# Patient Record
Sex: Female | Born: 1990 | Marital: Married | Smoking: Never smoker
Health system: Southern US, Community
[De-identification: ages and names within clinical notes are randomized; demographics above are authoritative.]

## PROBLEM LIST (undated history)

## (undated) DIAGNOSIS — K859 Acute pancreatitis without necrosis or infection, unspecified: Secondary | ICD-10-CM

## (undated) DIAGNOSIS — Z8489 Family history of other specified conditions: Secondary | ICD-10-CM

## (undated) DIAGNOSIS — K8020 Calculus of gallbladder without cholecystitis without obstruction: Secondary | ICD-10-CM

## (undated) DIAGNOSIS — R591 Generalized enlarged lymph nodes: Secondary | ICD-10-CM

## (undated) DIAGNOSIS — K851 Biliary acute pancreatitis without necrosis or infection: Secondary | ICD-10-CM

## (undated) HISTORY — DX: Acute pancreatitis without necrosis or infection, unspecified: K85.90

## (undated) HISTORY — DX: Calculus of gallbladder without cholecystitis without obstruction: K80.20

## (undated) HISTORY — DX: Biliary acute pancreatitis without necrosis or infection: K85.10

---

## 2012-08-16 ENCOUNTER — Ambulatory Visit: Payer: Self-pay | Admitting: Family Medicine

## 2012-08-16 LAB — RAPID STREP-A WITH REFLX: Micro Text Report: NEGATIVE

## 2012-08-18 LAB — BETA STREP CULTURE(ARMC)

## 2014-02-03 ENCOUNTER — Encounter (HOSPITAL_COMMUNITY): Payer: Self-pay | Admitting: Emergency Medicine

## 2014-02-03 ENCOUNTER — Emergency Department (HOSPITAL_COMMUNITY)
Admission: EM | Admit: 2014-02-03 | Discharge: 2014-02-04 | Disposition: A | Discharge status: Home / Self Care | Payer: Self-pay | Attending: Emergency Medicine | Admitting: Emergency Medicine

## 2014-02-03 DIAGNOSIS — R55 Syncope and collapse: Secondary | ICD-10-CM | POA: Insufficient documentation

## 2014-02-03 DIAGNOSIS — Z79899 Other long term (current) drug therapy: Secondary | ICD-10-CM | POA: Insufficient documentation

## 2014-02-03 DIAGNOSIS — O9989 Other specified diseases and conditions complicating pregnancy, childbirth and the puerperium: Secondary | ICD-10-CM | POA: Insufficient documentation

## 2014-02-03 LAB — CBC WITH DIFFERENTIAL/PLATELET
Basophils Absolute: 0.1 10*3/uL (ref 0.0–0.1)
Basophils Relative: 1 % (ref 0–1)
Eosinophils Absolute: 0.1 10*3/uL (ref 0.0–0.7)
Eosinophils Relative: 1 % (ref 0–5)
HEMATOCRIT: 35.1 % — AB (ref 36.0–46.0)
HEMOGLOBIN: 11.6 g/dL — AB (ref 12.0–15.0)
LYMPHS ABS: 2 10*3/uL (ref 0.7–4.0)
LYMPHS PCT: 20 % (ref 12–46)
MCH: 27.8 pg (ref 26.0–34.0)
MCHC: 33 g/dL (ref 30.0–36.0)
MCV: 84.2 fL (ref 78.0–100.0)
MONO ABS: 0.8 10*3/uL (ref 0.1–1.0)
Monocytes Relative: 8 % (ref 3–12)
NEUTROS ABS: 7.3 10*3/uL (ref 1.7–7.7)
NEUTROS PCT: 70 % (ref 43–77)
Platelets: 303 10*3/uL (ref 150–400)
RBC: 4.17 MIL/uL (ref 3.87–5.11)
RDW: 13.6 % (ref 11.5–15.5)
WBC: 10.2 10*3/uL (ref 4.0–10.5)

## 2014-02-03 LAB — URINE MICROSCOPIC-ADD ON

## 2014-02-03 LAB — URINALYSIS, ROUTINE W REFLEX MICROSCOPIC
GLUCOSE, UA: NEGATIVE mg/dL
Hgb urine dipstick: NEGATIVE
KETONES UR: 40 mg/dL — AB
NITRITE: NEGATIVE
PH: 5.5 (ref 5.0–8.0)
Protein, ur: 30 mg/dL — AB
SPECIFIC GRAVITY, URINE: 1.031 — AB (ref 1.005–1.030)
Urobilinogen, UA: 1 mg/dL (ref 0.0–1.0)

## 2014-02-03 LAB — I-STAT CHEM 8, ED
BUN: <3 mg/dL — ABNORMAL LOW (ref 6–23)
CHLORIDE: 108 meq/L (ref 96–112)
Calcium, Ion: 1.15 mmol/L (ref 1.12–1.23)
Creatinine, Ser: 0.6 mg/dL (ref 0.50–1.10)
GLUCOSE: 83 mg/dL (ref 70–99)
HCT: 38 % (ref 36.0–46.0)
Hemoglobin: 12.9 g/dL (ref 12.0–15.0)
POTASSIUM: 3.4 meq/L — AB (ref 3.7–5.3)
Sodium: 136 mEq/L — ABNORMAL LOW (ref 137–147)
TCO2: 23 mmol/L (ref 0–100)

## 2014-02-03 LAB — PREGNANCY, URINE: PREG TEST UR: POSITIVE — AB

## 2014-02-03 LAB — CBG MONITORING, ED: GLUCOSE-CAPILLARY: 87 mg/dL (ref 70–99)

## 2014-02-03 MED ORDER — SODIUM CHLORIDE 0.9 % IV BOLUS (SEPSIS)
1000.0000 mL | Freq: Once | INTRAVENOUS | Status: AC
  Administered 2014-02-03: 1000 mL via INTRAVENOUS

## 2014-02-03 NOTE — ED Provider Notes (Signed)
CSN: [id number]     Arrival date & time 02/03/14  1943 History   First MD Initiated Contact with Patient 02/03/14 2003     Chief Complaint  Patient presents with  . Loss of Consciousness     (Consider location/radiation/quality/duration/timing/severity/associated sxs/prior Treatment) HPI Comments: Patient presents emergency department with chief complaint of syncopal episode. She states that while at work tonight, she became dizzy, and passed out for approximately 15 seconds. She was caught by her coworkers and gently lowered to the ground. She was not injured. There is no seizure like activity. She denies any chest pain, shortness of breath, or abdominal pain. She is [redacted] weeks pregnant, but denies any vaginal bleeding, or new for discharge. There are no aggravating or alleviating factors. No history of heart problems.  The history is provided by the patient. No language interpreter was used.    Past Medical History  Diagnosis Date  . Pregnant    History reviewed. No pertinent past surgical history. No family history on file. History  Substance Use Topics  . Smoking status: Never Smoker   . Smokeless tobacco: Not on file  . Alcohol Use: No   OB History   Grav Para Term Preterm Abortions TAB SAB Ect Mult Living   1              Review of Systems  All other systems reviewed and are negative.     Allergies  Review of patient's allergies indicates not on file.  Home Medications   Prior to Admission medications   Medication Sig Start Date End Date Taking? Authorizing Provider  acetaminophen (TYLENOL) 325 MG tablet Take 650 mg by mouth every 6 (six) hours as needed for moderate pain.   Yes Historical Provider, MD  Prenatal w/o A Vit-Fe Fum-FA (PRENATAL VITAMIN W/FE, FA) 29-1 MG CHEW Chew 1 tablet by mouth daily.   Yes Historical Provider, MD   BP 118/56  Pulse 106  Temp(Src) 98.2 F (36.8 C) (Oral)  Resp 24  SpO2 99%  LMP 10/11/2013 Physical Exam  Nursing note and  vitals reviewed. Constitutional: She is oriented to person, place, and time. She appears well-developed and well-nourished.  HENT:  Head: Normocephalic and atraumatic.  Eyes: Conjunctivae and EOM are normal. Pupils are equal, round, and reactive to light.  Neck: Normal range of motion. Neck supple.  Cardiovascular: Normal rate and regular rhythm.  Exam reveals no gallop and no friction rub.   No murmur heard. Pulmonary/Chest: Effort normal and breath sounds normal. No respiratory distress. She has no wheezes. She has no rales. She exhibits no tenderness.  Abdominal: Soft. Bowel sounds are normal. She exhibits no distension and no mass. There is no tenderness. There is no rebound and no guarding.  No focal abdominal tenderness, no RLQ tenderness or pain at McBurney's point, no RUQ tenderness or Murphy's sign, no left-sided abdominal tenderness, no fluid wave, or signs of peritonitis   Musculoskeletal: Normal range of motion. She exhibits no edema and no tenderness.  Neurological: She is alert and oriented to person, place, and time.  Skin: Skin is warm and dry.  Psychiatric: She has a normal mood and affect. Her behavior is normal. Judgment and thought content normal.    ED Course  Procedures (including critical care time) Results for orders placed during the hospital encounter of 02/03/14  URINALYSIS, ROUTINE W REFLEX MICROSCOPIC      Result Value Ref Range   Color, Urine AMBER (*) YELLOW   APPearance CLOUDY (*)  CLEAR   Specific Gravity, Urine 1.031 (*) 1.005 - 1.030   pH 5.5  5.0 - 8.0   Glucose, UA NEGATIVE  NEGATIVE mg/dL   Hgb urine dipstick NEGATIVE  NEGATIVE   Bilirubin Urine SMALL (*) NEGATIVE   Ketones, ur 40 (*) NEGATIVE mg/dL   Protein, ur 30 (*) NEGATIVE mg/dL   Urobilinogen, UA 1.0  0.0 - 1.0 mg/dL   Nitrite NEGATIVE  NEGATIVE   Leukocytes, UA SMALL (*) NEGATIVE  CBC WITH DIFFERENTIAL      Result Value Ref Range   WBC 10.2  4.0 - 10.5 K/uL   RBC 4.17  3.87 - 5.11  MIL/uL   Hemoglobin 11.6 (*) 12.0 - 15.0 g/dL   HCT 11.9 (*) 14.7 - 82.9 %   MCV 84.2  78.0 - 100.0 fL   MCH 27.8  26.0 - 34.0 pg   MCHC 33.0  30.0 - 36.0 g/dL   RDW 56.2  13.0 - 86.5 %   Platelets 303  150 - 400 K/uL   Neutrophils Relative % 70  43 - 77 %   Neutro Abs 7.3  1.7 - 7.7 K/uL   Lymphocytes Relative 20  12 - 46 %   Lymphs Abs 2.0  0.7 - 4.0 K/uL   Monocytes Relative 8  3 - 12 %   Monocytes Absolute 0.8  0.1 - 1.0 K/uL   Eosinophils Relative 1  0 - 5 %   Eosinophils Absolute 0.1  0.0 - 0.7 K/uL   Basophils Relative 1  0 - 1 %   Basophils Absolute 0.1  0.0 - 0.1 K/uL  URINE MICROSCOPIC-ADD ON      Result Value Ref Range   Squamous Epithelial / LPF FEW (*) RARE   WBC, UA 0-2  <3 WBC/hpf   RBC / HPF 0-2  <3 RBC/hpf   Bacteria, UA FEW (*) RARE   Urine-Other MUCOUS PRESENT    PREGNANCY, URINE      Result Value Ref Range   Preg Test, Ur POSITIVE (*) NEGATIVE  CBG MONITORING, ED      Result Value Ref Range   Glucose-Capillary 87  70 - 99 mg/dL  I-STAT CHEM 8, ED      Result Value Ref Range   Sodium 136 (*) 137 - 147 mEq/L   Potassium 3.4 (*) 3.7 - 5.3 mEq/L   Chloride 108  96 - 112 mEq/L   BUN <3 (*) 6 - 23 mg/dL   Creatinine, Ser 7.84  0.50 - 1.10 mg/dL   Glucose, Bld 83  70 - 99 mg/dL   Calcium, Ion 6.96  2.95 - 1.23 mmol/L   TCO2 23  0 - 100 mmol/L   Hemoglobin 12.9  12.0 - 15.0 g/dL   HCT 28.4  13.2 - 44.0 %   No results found.   Imaging Review No results found.   EKG Interpretation None     ED ECG REPORT  I personally interpreted this EKG   Date: 02/03/2014   Rate: 95  Rhythm: normal sinus rhythm  QRS Axis: normal  Intervals: normal  ST/T Wave abnormalities: normal  Conduction Disutrbances:none  Narrative Interpretation:   Old EKG Reviewed: none available    MDM   Final diagnoses:  Syncope, unspecified syncope type    Patient with syncopal episode.  Suspect vasovagal.  No chest pain or abdominal pain.  Feels better now.  Not  orthostatic.  States she probably hasn't been drinking enough.  Will give 1 L of fluids  and reassess.  Low risk for serious outcome per [facility] Syncope Rule.  No hx of CHF, hematocrit >30%, no new EKG changes or arrhythmias, no SOB, systolic BP >90 in triage.  12:08 AM Feeling better now.  Ambulates without dizziness.  Discussed with Dr. Fayrene Fearing.  Will discharge to home with PCP follow-up.  Return precautions given.  Patient understands and agrees with the plan.  She is stable and ready for discharge.     Roxy Horseman, PA-C 02/04/14 0009

## 2014-02-03 NOTE — ED Notes (Signed)
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  RN that was with her reports positive LOC for approx 15 sec. Denies dizziness at present.  C/o nausea.  Vomited x 2.    Pt is [redacted] weeks pregnant.

## 2014-02-03 NOTE — ED Notes (Signed)
Mini lab did not receive POCT urine pregnancy, will add on for lab to do.

## 2014-02-03 NOTE — ED Notes (Signed)
Pt ambulated without difficulties. Pt reports new onset HA. Pt reports she doesn't feel well. Family at [facility] reports pt is pale.

## 2014-02-03 NOTE — ED Notes (Signed)
Reported from Lucien, NT CBG 80.

## 2014-02-03 NOTE — ED Notes (Signed)
Pt reports she was in the middle of taking report, pt states she felt hot and dizzy and then had a witnessed syncopal episode for about 15 seconds. Pt does not remember the event only that she felt dizzy. Pt did not hit her head. Pt is A&O upon arrival to unit. Pt states she is [redacted] weeks pregnant.

## 2014-02-04 NOTE — Discharge Instructions (Signed)
Syncope °Syncope is a medical term for fainting or passing out. This means you lose consciousness and drop to the ground. People are generally unconscious for less than 5 minutes. You may have some muscle twitches for up to 15 seconds before waking up and returning to normal. Syncope occurs more often in older adults, but it can happen to anyone. While most causes of syncope are not dangerous, syncope can be a sign of a serious medical problem. It is important to seek medical care.  °CAUSES  °Syncope is caused by a sudden drop in blood flow to the brain. The specific cause is often not determined. Factors that can bring on syncope include: °· Taking medicines that lower blood pressure. °· Sudden changes in posture, such as standing up quickly. °· Taking more medicine than prescribed. °· Standing in one place for too long. °· Seizure disorders. °· Dehydration and excessive exposure to heat. °· Low blood sugar (hypoglycemia). °· Straining to have a bowel movement. °· Heart disease, irregular heartbeat, or other circulatory problems. °· Fear, emotional distress, seeing blood, or severe pain. °SYMPTOMS  °Right before fainting, you may: °· Feel dizzy or light-headed. °· Feel nauseous. °· See all white or all black in your field of vision. °· Have cold, clammy skin. °DIAGNOSIS  °Your health care provider will ask about your symptoms, perform a physical exam, and perform an electrocardiogram (ECG) to record the electrical activity of your heart. Your health care provider may also perform other heart or blood tests to determine the cause of your syncope which may include: °· Transthoracic echocardiogram (TTE). During echocardiography, sound waves are used to evaluate how blood flows through your heart. °· Transesophageal echocardiogram (TEE). °· Cardiac monitoring. This allows your health care provider to monitor your heart rate and rhythm in real time. °· Holter monitor. This is a portable device that records your  heartbeat and can help diagnose heart arrhythmias. It allows your health care provider to track your heart activity for several days, if needed. °· Stress tests by exercise or by giving medicine that makes the heart beat faster. °TREATMENT  °In most cases, no treatment is needed. Depending on the cause of your syncope, your health care provider may recommend changing or stopping some of your medicines. °HOME CARE INSTRUCTIONS °· Have someone stay with you until you feel stable. °· Do not drive, use machinery, or play sports until your health care provider says it is okay. °· Keep all follow-up appointments as directed by your health care provider. °· Lie down right away if you start feeling like you might faint. Breathe deeply and steadily. Wait until all the symptoms have passed. °· Drink enough fluids to keep your urine clear or pale yellow. °· If you are taking blood pressure or heart medicine, get up slowly and take several minutes to sit and then stand. This can reduce dizziness. °SEEK IMMEDIATE MEDICAL CARE IF:  °· You have a severe headache. °· You have unusual pain in the chest, abdomen, or back. °· You are bleeding from your mouth or rectum, or you have black or tarry stool. °· You have an irregular or very fast heartbeat. °· You have pain with breathing. °· You have repeated fainting or seizure-like jerking during an episode. °· You faint when sitting or lying down. °· You have confusion. °· You have trouble walking. °· You have severe weakness. °· You have vision problems. °If you fainted, call your local emergency services (911 in [place] Do not drive   yourself to the hospital.  °MAKE SURE YOU: °· Understand these instructions. °· Will watch your condition. °· Will get help right away if you are not doing well or get worse. °Document Released: 05/07/2005 Document Revised: 05/12/2013 Document Reviewed: 07/06/2011 °ExitCare® Patient Information ©2015 [employer]. This information is not intended to replace  advice given to you by your health care provider. Make sure you discuss any questions you have with your health care provider. ° °

## 2014-02-11 NOTE — ED Provider Notes (Signed)
Medical screening examination/treatment/procedure(s) were performed by non-physician practitioner and as supervising physician I was immediately available for consultation/collaboration.   EKG Interpretation   Date/Time:  Wednesday February 03 2014 19:55:57 EDT Ventricular Rate:  95 PR Interval:  154 QRS Duration: 78 QT Interval:  340 QTC Calculation: 427 R Axis:   19 Text Interpretation:  Normal sinus rhythm Normal ECG ED PHYSICIAN  INTERPRETATION AVAILABLE IN [facility] Confirmed by TEST, Record  ([id number]) on 02/05/2014 9:42:03 AM        Rolland Porter, MD 02/11/14 [id number]

## 2014-02-16 ENCOUNTER — Emergency Department: Payer: Self-pay | Admitting: Emergency Medicine

## 2014-02-16 LAB — CBC
HCT: 35.2 % (ref 35.0–47.0)
HGB: 11.5 g/dL — ABNORMAL LOW (ref 12.0–16.0)
MCH: 27.9 pg (ref 26.0–34.0)
MCHC: 32.7 g/dL (ref 32.0–36.0)
MCV: 85 fL (ref 80–100)
Platelet: 309 10*3/uL (ref 150–440)
RBC: 4.13 10*6/uL (ref 3.80–5.20)
RDW: 13.2 % (ref 11.5–14.5)
WBC: 12 10*3/uL — AB (ref 3.6–11.0)

## 2014-02-16 LAB — URINALYSIS, COMPLETE
Bilirubin,UR: NEGATIVE
Glucose,UR: NEGATIVE mg/dL (ref 0–75)
Nitrite: NEGATIVE
Ph: 5 (ref 4.5–8.0)
Protein: NEGATIVE
SPECIFIC GRAVITY: 1.021 (ref 1.003–1.030)
Squamous Epithelial: 8

## 2014-02-16 LAB — HCG, QUANTITATIVE, PREGNANCY: Beta Hcg, Quant.: 16342 m[IU]/mL — ABNORMAL HIGH

## 2014-03-22 ENCOUNTER — Encounter (HOSPITAL_COMMUNITY): Payer: Self-pay | Admitting: Emergency Medicine

## 2014-07-08 ENCOUNTER — Observation Stay: Payer: Self-pay | Admitting: Obstetrics and Gynecology

## 2014-07-11 ENCOUNTER — Inpatient Hospital Stay: Payer: Self-pay

## 2014-08-20 LAB — HM PAP SMEAR: HM Pap smear: NORMAL

## 2014-09-28 NOTE — H&P (Signed)
L&D Evaluation:  History Expanded:  HPI 24 year old G1 with EDD of 07/18/2014 per 7 wk US presents at 39w 4d with c/o blood show and contractions, vomiting that started on admission. Pt was checked in office 3 days ago, dilated 3 cm and membranes stripped. BP was also elevated at that time, but pre-e workup was negative. [facility]. H/o LEEP with normal cervical length at 16 & 20 wks. TDAP & Flu UTD   Blood Type (Maternal) O positive   Group B Strep Results Maternal (Result >5wks must be treated as unknown) negative   Maternal HIV Negative   Maternal Syphilis Ab Nonreactive   Maternal Varicella Immune   Rubella Results (Maternal) immune   Maternal T-Dap Immune   Presents with contractions   Patient's Medical History No Chronic Illness   Patient's Surgical History LEEP   Medications Pre Natal Vitamins   Allergies NKDA   Social History none   Family History Non-Contributory   ROS:  ROS All systems were reviewed.  HEENT, CNS, GI, GU, Respiratory, CV, Renal and Musculoskeletal systems were found to be normal.   Exam:  Vital Signs 143/87 initial BP,   General appears uncomfortable   Mental Status clear   Abdomen gravid, tender with contractions   Estimated Fetal Weight Average for gestational age   Pelvic no external lesions, 3/100/-1 to 3.5 cm after 1 hour   Mebranes Intact   FHT normal rate with no decels, category 1 tracing with baseline 130, mod variability, + accels, no decels   Ucx regular   Impression:  Impression active labor   Plan:  Plan monitor contractions and for cervical change   Comments Phenergan IM for nausea/vomiting Admit for labor, IV medication now and epidural when labs are returned   Electronic Signatures: Vella KohlerBrothers, Mccrae Speciale K (CNM)  (Signed 21-Feb-16 22:59)  Authored: L&D Evaluation   Last Updated: 21-Feb-16 22:59 by Vella KohlerBrothers, Zelma Mazariego K (CNM)

## 2014-09-28 NOTE — H&P (Signed)
L&D Evaluation:  History:  HPI 24 year old G[facility] to clinic today for routine prental appointment with blood pressure of 138/95 and repeat 140/80.  She denies HA, vision changes, RUQ or epigastric pain.  No increased edema weight stable over the last week 1lbs.  Trace protein in clinic today.   Presents with elevated BP reading in clinic   Patient's Medical History No Chronic Illness   Patient's Surgical History LEEP   Medications Pre Natal Vitamins   Allergies NKDA   Social History none   Family History Non-Contributory   ROS:  ROS All systems were reviewed.  HEENT, CNS, GI, GU, Respiratory, CV, Renal and Musculoskeletal systems were found to be normal.   Exam:  Vital Signs stable  139/72, 113/54, 108/54, 103/60, 111/60, 101/53, 93/43   Urine Protein P/C ratio pending   Impression:  Impression evaluation for PIH   Plan:  Plan monitor BP, PIH panel   Electronic Signatures: Lorrene ReidStaebler, Jalyssa Fleisher M (MD)  (Signed 18-Feb-16 17:39)  Authored: L&D Evaluation   Last Updated: 18-Feb-16 17:39 by Lorrene ReidStaebler, Dorell Gatlin M (MD)

## 2014-10-07 IMAGING — US US OB LIMITED
1 series · 14 of 25 positions shown · non-contrast
Comparison: none

CLINICAL DATA: Pain.  Vaginal bleeding.

EXAM:
LIMITED OBSTETRIC ULTRASOUND

[Series 1: us ob limited · 0.22mm/px · 14 of 25 slices shown]
[im 1/25]
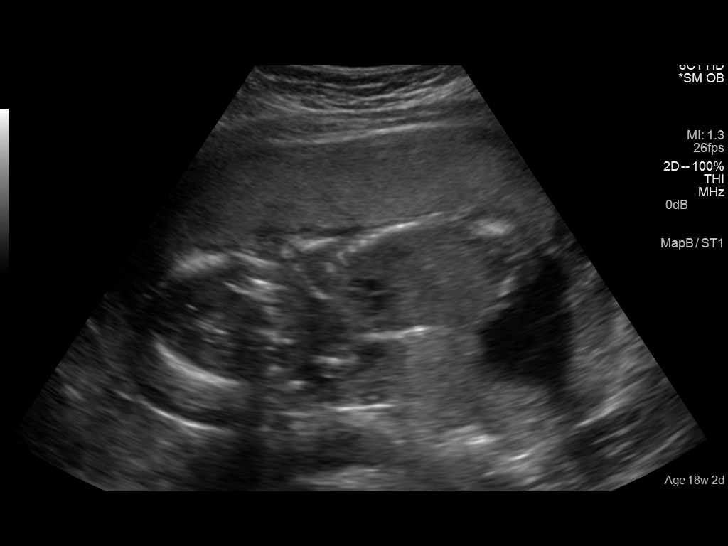
[im 3/25]
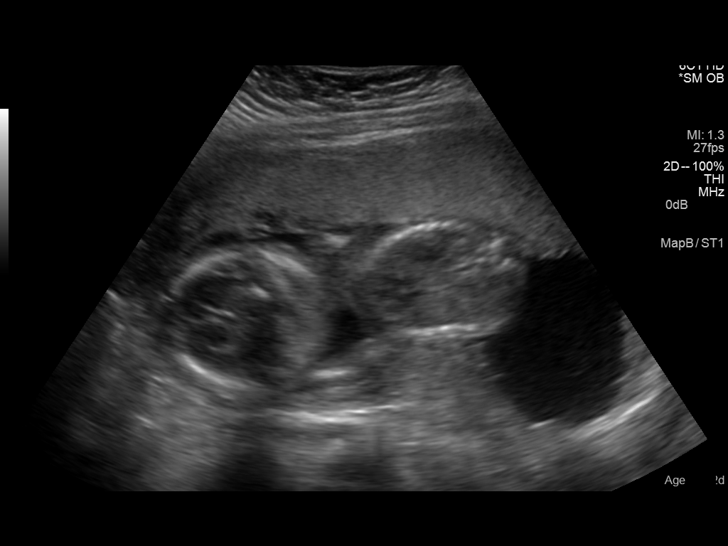
[im 5/25]
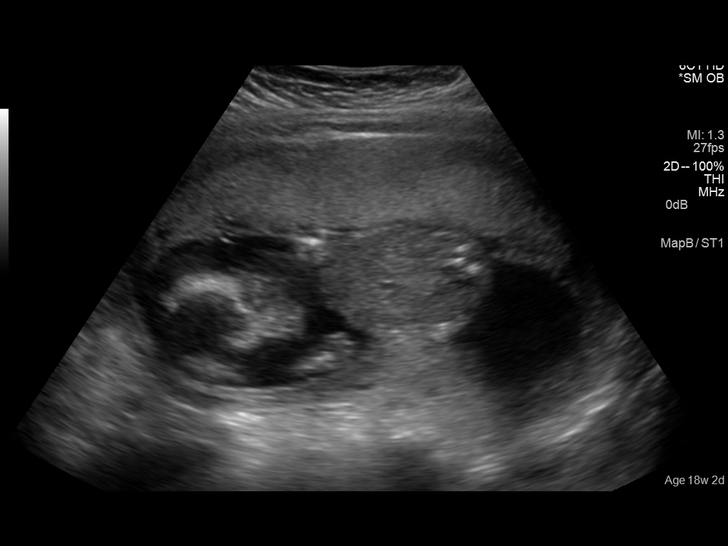
[im 7/25]
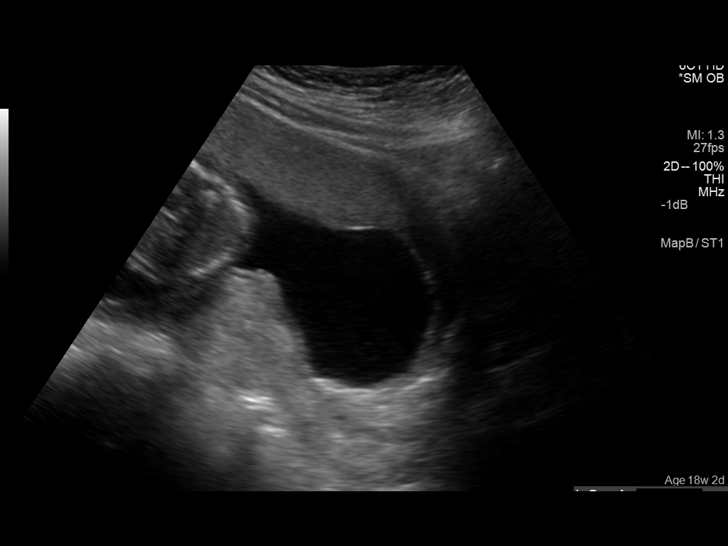
[im 9/25]
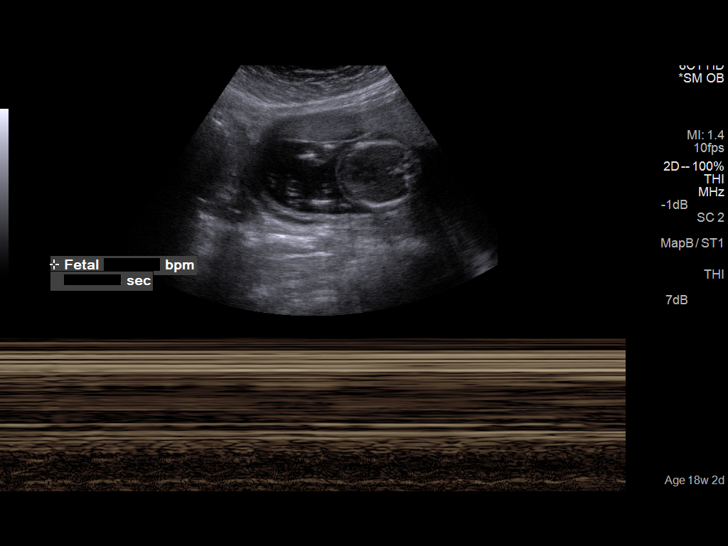
[im 10/25]
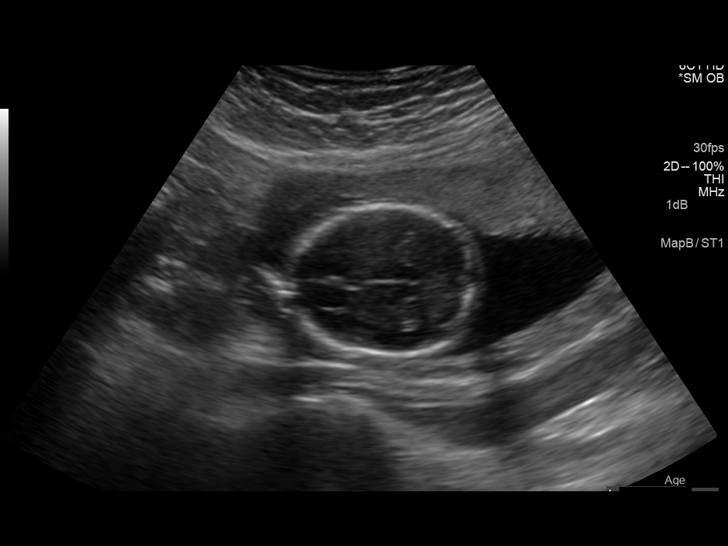
[im 12/25]
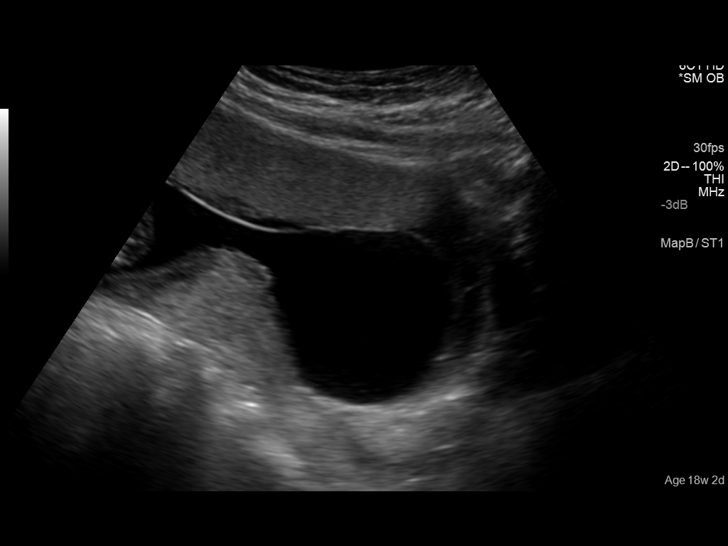
[im 14/25]
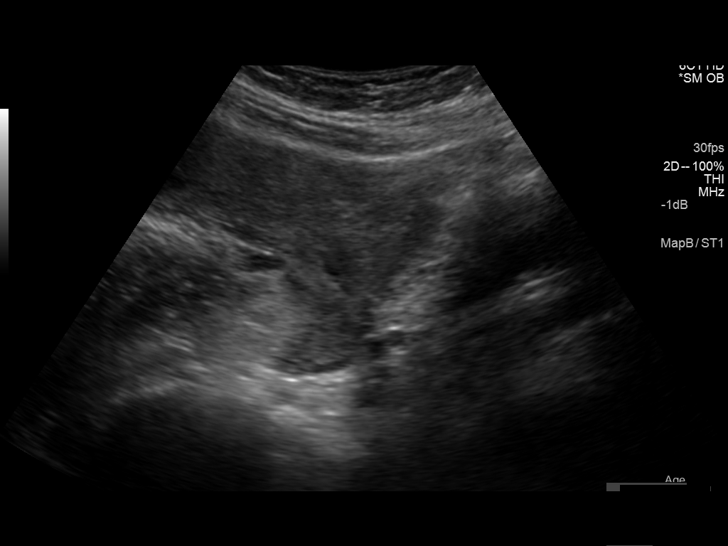
[im 16/25]
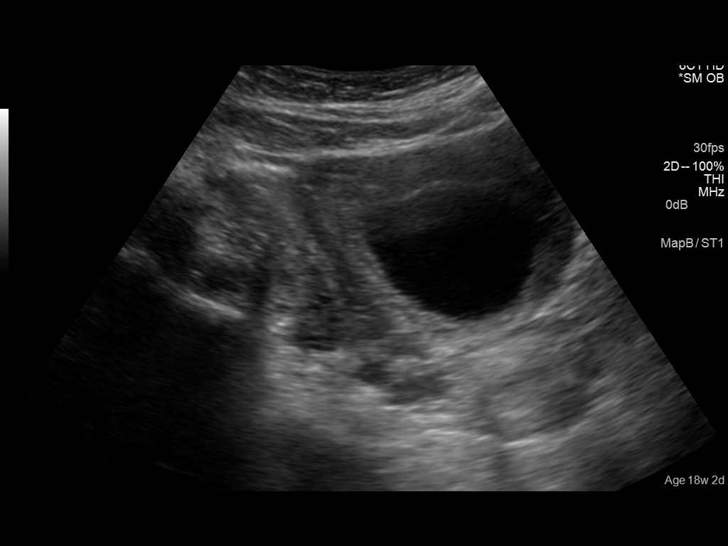
[im 17/25]
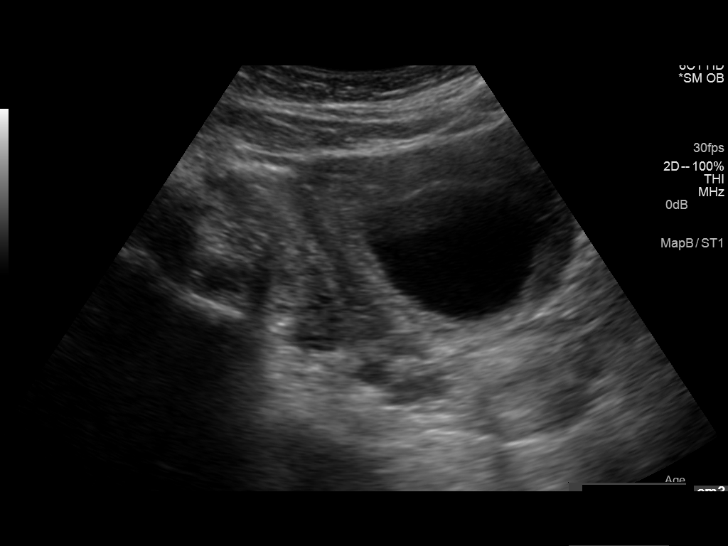
[im 19/25]
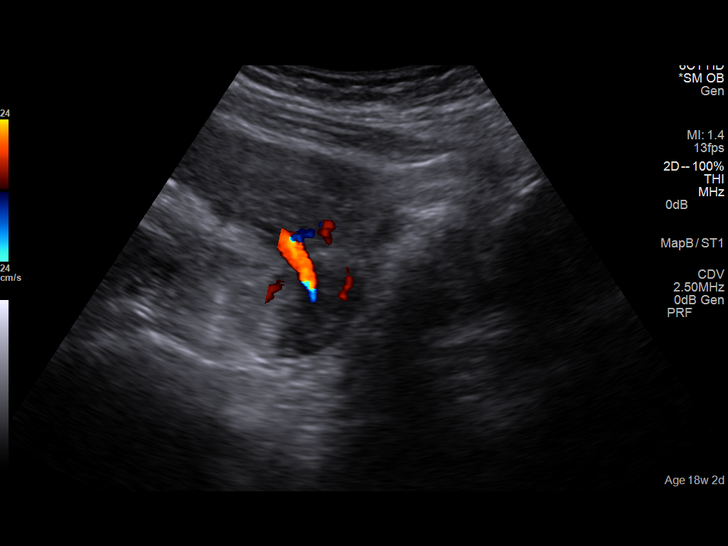
[im 21/25]
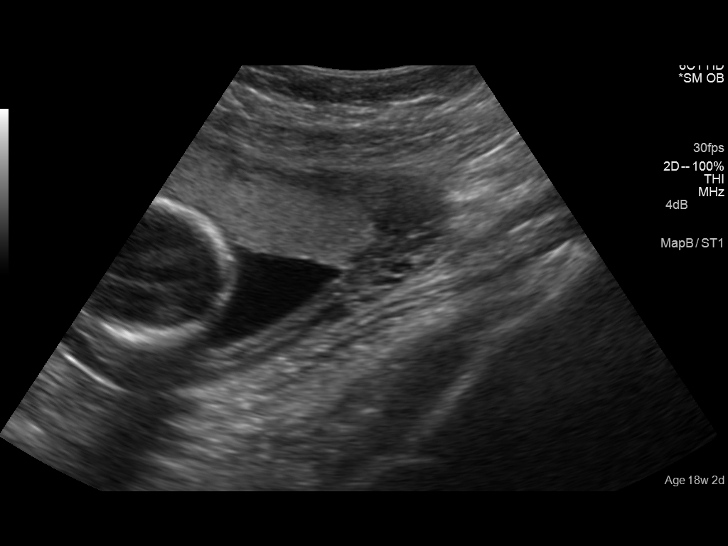
[im 23/25]
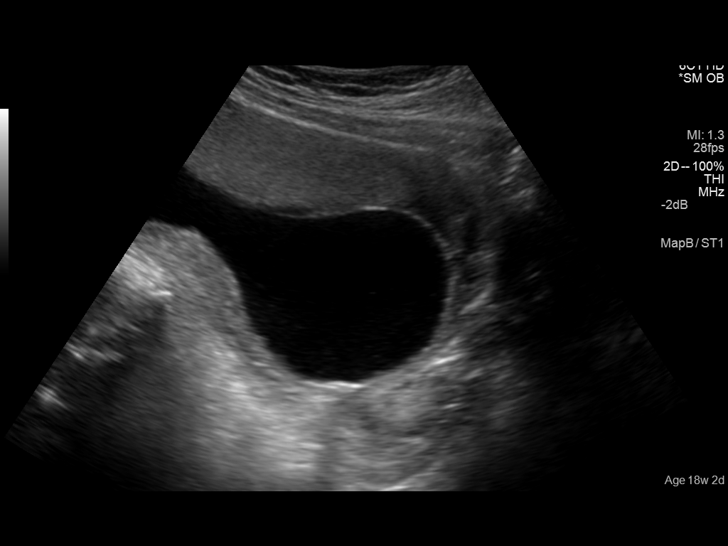
[im 25/25]
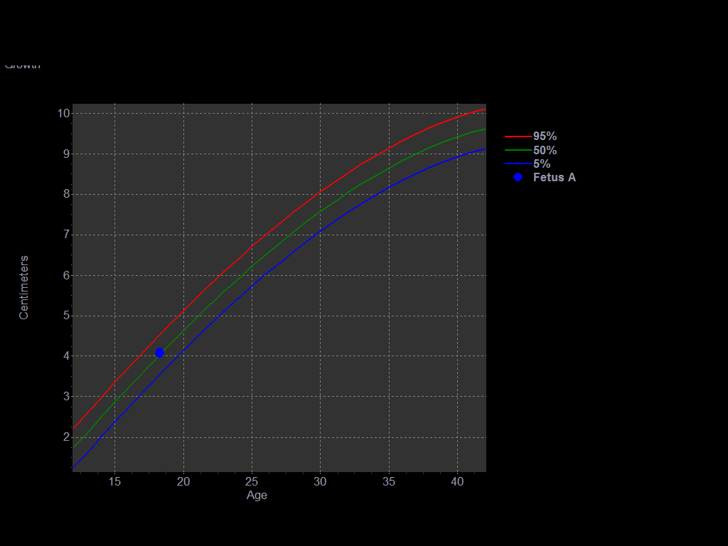

[14 of 25 positions shown; findings below may reference images not displayed]

FINDINGS: Number of Fetuses: 1

Heart Rate:  147 bpm

Movement: Yes

Presentation: Breech.

Placental Location: Anterior.

Previa: No

Amniotic Fluid (Subjective):  Within normal limits.

BPD:  4.1cm 18w  3d

MATERNAL FINDINGS:

Cervix:  Appears closed.

Uterus/Adnexae:  No abnormality visualized.
IMPRESSION: Single viable intrauterine pregnancy at 18 weeks 3 days in breech
presentation.

This exam is performed on an emergent basis and does not
comprehensively evaluate fetal size, dating, or anatomy; follow-up
complete OB US should be considered if further fetal assessment is
warranted.

## 2015-01-16 ENCOUNTER — Encounter: Payer: Self-pay | Admitting: Emergency Medicine

## 2015-01-16 ENCOUNTER — Inpatient Hospital Stay
Admission: EM | Admit: 2015-01-16 | Discharge: 2015-01-20 | DRG: 419 | Disposition: A | Discharge status: Home / Self Care | Payer: 59 | Attending: Surgery | Admitting: Surgery

## 2015-01-16 ENCOUNTER — Emergency Department: Payer: 59

## 2015-01-16 DIAGNOSIS — K8067 Calculus of gallbladder and bile duct with acute and chronic cholecystitis with obstruction: Secondary | ICD-10-CM | POA: Diagnosis not present

## 2015-01-16 DIAGNOSIS — Z9049 Acquired absence of other specified parts of digestive tract: Secondary | ICD-10-CM

## 2015-01-16 DIAGNOSIS — K859 Acute pancreatitis without necrosis or infection, unspecified: Secondary | ICD-10-CM

## 2015-01-16 DIAGNOSIS — K851 Biliary acute pancreatitis without necrosis or infection: Secondary | ICD-10-CM | POA: Diagnosis present

## 2015-01-16 DIAGNOSIS — K8070 Calculus of gallbladder and bile duct without cholecystitis without obstruction: Secondary | ICD-10-CM | POA: Diagnosis present

## 2015-01-16 DIAGNOSIS — R1011 Right upper quadrant pain: Secondary | ICD-10-CM

## 2015-01-16 DIAGNOSIS — K8020 Calculus of gallbladder without cholecystitis without obstruction: Secondary | ICD-10-CM

## 2015-01-16 HISTORY — DX: Calculus of gallbladder without cholecystitis without obstruction: K80.20

## 2015-01-16 HISTORY — DX: Acute pancreatitis without necrosis or infection, unspecified: K85.90

## 2015-01-16 LAB — COMPREHENSIVE METABOLIC PANEL
ALT: 453 U/L — ABNORMAL HIGH (ref 14–54)
ANION GAP: 11 (ref 5–15)
AST: 359 U/L — ABNORMAL HIGH (ref 15–41)
Albumin: 4.1 g/dL (ref 3.5–5.0)
Alkaline Phosphatase: 173 U/L — ABNORMAL HIGH (ref 38–126)
BILIRUBIN TOTAL: 7 mg/dL — AB (ref 0.3–1.2)
BUN: 10 mg/dL (ref 6–20)
CO2: 25 mmol/L (ref 22–32)
Calcium: 9.2 mg/dL (ref 8.9–10.3)
Chloride: 104 mmol/L (ref 101–111)
Creatinine, Ser: 0.77 mg/dL (ref 0.44–1.00)
Glucose, Bld: 108 mg/dL — ABNORMAL HIGH (ref 65–99)
POTASSIUM: 3.5 mmol/L (ref 3.5–5.1)
Sodium: 140 mmol/L (ref 135–145)
TOTAL PROTEIN: 8.9 g/dL — AB (ref 6.5–8.1)

## 2015-01-16 LAB — URINALYSIS COMPLETE WITH MICROSCOPIC (ARMC ONLY)
GLUCOSE, UA: NEGATIVE mg/dL
Ketones, ur: NEGATIVE mg/dL
LEUKOCYTES UA: NEGATIVE
Nitrite: NEGATIVE
Protein, ur: NEGATIVE mg/dL
Specific Gravity, Urine: 1.014 (ref 1.005–1.030)
pH: 5 (ref 5.0–8.0)

## 2015-01-16 LAB — CBC
HCT: 39.3 % (ref 35.0–47.0)
HEMOGLOBIN: 12.9 g/dL (ref 12.0–16.0)
MCH: 27.1 pg (ref 26.0–34.0)
MCHC: 32.9 g/dL (ref 32.0–36.0)
MCV: 82.6 fL (ref 80.0–100.0)
Platelets: 347 10*3/uL (ref 150–440)
RBC: 4.76 MIL/uL (ref 3.80–5.20)
RDW: 13.7 % (ref 11.5–14.5)
WBC: 9.6 10*3/uL (ref 3.6–11.0)

## 2015-01-16 LAB — LIPASE, BLOOD: LIPASE: 3198 U/L — AB (ref 22–51)

## 2015-01-16 LAB — POCT PREGNANCY, URINE: PREG TEST UR: NEGATIVE

## 2015-01-16 MED ORDER — ONDANSETRON 4 MG PO TBDP
ORAL_TABLET | ORAL | Status: AC
  Filled 2015-01-16: qty 1

## 2015-01-16 MED ORDER — ONDANSETRON 4 MG PO TBDP
4.0000 mg | ORAL_TABLET | Freq: Once | ORAL | Status: AC | PRN
  Administered 2015-01-16: 4 mg via ORAL

## 2015-01-16 MED ORDER — ONDANSETRON HCL 4 MG/2ML IJ SOLN
INTRAMUSCULAR | Status: AC
  Administered 2015-01-16: 4 mg via INTRAVENOUS
  Filled 2015-01-16: qty 2

## 2015-01-16 MED ORDER — MORPHINE SULFATE (PF) 4 MG/ML IV SOLN
4.0000 mg | Freq: Once | INTRAVENOUS | Status: AC
Start: 2015-01-16 — End: 2015-01-16
  Administered 2015-01-16: 4 mg via INTRAVENOUS
  Filled 2015-01-16: qty 1

## 2015-01-16 MED ORDER — ONDANSETRON HCL 4 MG/2ML IJ SOLN
4.0000 mg | Freq: Once | INTRAMUSCULAR | Status: AC
  Administered 2015-01-16: 4 mg via INTRAVENOUS

## 2015-01-16 NOTE — ED Provider Notes (Signed)
[facility] Emergency Department Provider Note   ____________________________________________  Time seen: 8 PM I have reviewed the triage vital signs and the triage nursing note.  HISTORY  Chief Complaint No chief complaint on file.   Historian Patient  HPI Kayla Parrish is a 24 y.o. female who is complaining of epigastric and right upper quadrant pain for about 3 days associated with nausea and vomiting. Denies fever. States she has had vomiting for about 3 days, however she hasn't really eaten anything in 3 days. She has had episodes like this in the past on and off over several months, however usually she waited a few hours it went away. The past 3 days it's been pretty severe the whole time but just waxing and waning. Symptoms are moderate to severe. Seems to be worse when she eats something, within several hours after eating it's worse.    Past Medical History  Diagnosis Date  . Pregnant   . Vaginal delivery     There are no active problems to display for this patient.   History reviewed. No pertinent past surgical history.  Current Outpatient Rx  Name  Route  Sig  Dispense  Refill  . acetaminophen (TYLENOL) 325 MG tablet   Oral   Take 650 mg by mouth every 6 (six) hours as needed for moderate pain.         . Prenatal w/o A Vit-Fe Fum-FA (PRENATAL VITAMIN W/FE, FA) 29-1 MG CHEW   Oral   Chew 1 tablet by mouth daily.           Allergies Review of patient's allergies indicates no known allergies.  History reviewed. No pertinent family history.  Social History Social History  Substance Use Topics  . Smoking status: Never Smoker   . Smokeless tobacco: Never Used  . Alcohol Use: Yes   patient is a cardiac nurse at [employer]  Review of Systems  Constitutional: Negative for fever. Eyes: Negative for visual changes. ENT: Negative for sore throat. Cardiovascular: Negative for chest pain. Respiratory: Negative for shortness of  breath. Gastrointestinal: No rectal bleeding. Other GI symptoms as per history of present illness Genitourinary: Negative for dysuria. Musculoskeletal: Negative for back pain. Skin: Negative for rash. Neurological: Negative for headache. 10 point Review of Systems otherwise negative ____________________________________________   PHYSICAL EXAM:  VITAL SIGNS: ED Triage Vitals  Enc Vitals Group     BP 01/16/15 1927 137/81 mmHg     Pulse Rate 01/16/15 1927 84     Resp 01/16/15 1927 18     Temp 01/16/15 1927 98.5 F (36.9 C)     Temp Source 01/16/15 1927 Oral     SpO2 01/16/15 1927 96 %     Weight 01/16/15 1927 180 lb (81.647 kg)     Height 01/16/15 1927 5' (1.524 m)     Head Cir --      Peak Flow --      Pain Score 01/16/15 1930 7     Pain Loc --      Pain Edu? --      Excl. in GC? --      Constitutional: Alert and oriented. Well appearing and in no distress. Eyes: Conjunctivae are normal. PERRL. Normal extraocular movements. ENT   Head: Normocephalic and atraumatic.   Nose: No congestion/rhinnorhea.   Mouth/Throat: Mucous membranes are moist.   Neck: No stridor. Cardiovascular/Chest: Normal rate, regular rhythm.  No murmurs, rubs, or gallops. Respiratory: Normal respiratory effort without tachypnea nor  retractions. Breath sounds are clear and equal bilaterally. No wheezes/rales/rhonchi. Gastrointestinal: Soft. No distention.  Moderate tenderness in the right upper quadrant and epigastrium and left upper quadrant. No guarding or rebound. Genitourinary/rectal:Deferred Musculoskeletal: Nontender with normal range of motion in all extremities. No joint effusions.  No lower extremity tenderness.  No edema. Neurologic:  Normal speech and language. No gross or focal neurologic deficits are appreciated. Skin:  Skin is warm, dry and intact. No rash noted. Psychiatric: Mood and affect are normal. Speech and behavior are normal. Patient exhibits appropriate insight and  judgment.  ____________________________________________   EKG I, Governor Rooks, MD, the attending physician have personally viewed and interpreted all ECGs.  No EKG performed ____________________________________________  LABS (pertinent positives/negatives)  Pregnancy test negative Lipase 3198 Complete metabolic panel significant for AST 359, a LT 453, alkaline phosphatase 173 and bilirubin 7.0 Electrolytes and creatinine/BUN are within normal limits CBC shows a normal white blood count 9.6 and hemoglobin 12.9 Urinalysis is unremarkable  ____________________________________________  RADIOLOGY All Xrays were viewed by me. Imaging interpreted by Radiologist.  Right upper quadrant ultrasound:  IMPRESSION: Cholelithiasis, with mild gallbladder wall thickening. However, no ultrasonographic Murphy's sign is elicited. This may reflect mild chronic inflammation, without definite evidence for acute cholecystitis or obstruction. __________________________________________  PROCEDURES  Procedure(s) performed: None  Critical Care performed: None  ____________________________________________   ED COURSE / ASSESSMENT AND PLAN  CONSULTATIONS: Phone consultation with Dr. Egbert Garibaldi, general surgery for admission.  Pertinent labs & imaging results that were available during my care of the patient were reviewed by me and considered in my medical decision making (see chart for details).   Patient's symptoms are clinically concerning for biliary colic as source of her discomfort.  Patient was treated symptomatically for pain and nausea.  Laboratory evaluation shows evidence of gallstone pancreatitis. Ultrasound shows gallstones without evidence of cholecystitis. Clinically I do not suspect cholecystitis with no fever, and no elevated white blood cell count. Vital signs are stable.  Consulted with general surgery for admission.  Patient / Family / Caregiver informed of clinical course,  medical decision-making process, and agree with plan.   ___________________________________________   FINAL CLINICAL IMPRESSION(S) / ED DIAGNOSES   Final diagnoses:  Right upper quadrant pain  Acute gallstone pancreatitis       Governor Rooks, MD 01/16/15 2159

## 2015-01-16 NOTE — ED Notes (Signed)
Pt states has had bilateral upper quadrant pain for 3 days with associated nausea and vomiting. Pt states "i haven't been able to keep anything down". Pt denies fever, states has not had a bowel movement on 01/12/2015.

## 2015-01-16 NOTE — ED Notes (Signed)
Patient transported to Ultrasound 

## 2015-01-16 NOTE — H&P (Signed)
Kayla Parrish is an 24 y.o. female.     Chief Complaint:   upper abdominal pain since Saturday 4 am.  HPI:   24 y/o female 6 months post-partum presents with the sudden onset of upper abdominal pain and RUQ pain starting at 4 am on Sat morning.  States pain radiates into her right flank.  She had burger the night prior for dinner.  Describes pain, nausea and emesis all weekend.  Unable to eat as this causes her more significant pain.   No fevers, no jaundice noticed, no sick contacts, no alleviating factors.  In prior 6 months she has had multiple episodes of similar but less intense pain awakening her at night located in her upper abdomen and RUQ which she has attributed to intestinal gas and for which she treated herself as such.  No diarrhea, no weight loss, no history of inflammatory bowel disease or diverticulitis.  No ETOH or recent drug ingestion.  Uncomplicated recent first pregnancy ending in NSVD 6 months ago.  Accompanied by her mother to the ER.  Past Medical History  Diagnosis Date  . Pregnant   . Vaginal delivery    Past medical history: none  No past surgical history.  Social History:  reports that she has never smoked. She has never used smokeless tobacco. She reports that she drinks alcohol. She reports that she does not use illicit drugs.  Family history negative for cholelithiasis.  Allergies: No Known Allergies  Review of Systems  Constitutional: Positive for malaise/fatigue. Negative for fever, chills, weight loss and diaphoresis.  HENT: Negative for ear discharge, ear pain, hearing loss and tinnitus.   Eyes: Negative.   Respiratory: Negative for cough, hemoptysis, sputum production and shortness of breath.   Cardiovascular: Negative for chest pain, palpitations, orthopnea and claudication.  Gastrointestinal: Positive for heartburn, nausea, vomiting and abdominal pain. Negative for diarrhea, constipation, blood in stool and melena.  Genitourinary: Negative.     Musculoskeletal: Negative.   Skin: Negative for itching and rash.  Neurological: Positive for weakness. Negative for headaches.  Endo/Heme/Allergies: Negative.   Psychiatric/Behavioral: Negative.     Physical Exam  Constitutional: She is oriented to person, place, and time and well-developed, well-nourished, and in no distress.  HENT:  Head: Normocephalic and atraumatic.  Eyes: Pupils are equal, round, and reactive to light. Scleral icterus is present.  Cardiovascular: Normal rate and regular rhythm.   Pulmonary/Chest: Effort normal and breath sounds normal. No respiratory distress.  Abdominal: Soft. Normal appearance. She exhibits no distension, no ascites and no mass. There is tenderness in the right upper quadrant and epigastric area. There is positive Murphy's sign. There is no rigidity, no rebound and no guarding. No hernia.    Neurological: She is oriented to person, place, and time.  Skin: Skin is warm and dry.  Psychiatric: Mood, memory, affect and judgment normal.    Blood pressure 133/92, pulse 66, temperature 98.5 F (36.9 C), temperature source Oral, resp. rate 16, height 5' (1.524 m), weight 180 lb (81.647 kg), last menstrual period 01/16/2015, SpO2 96 %, unknown if currently breastfeeding.  Results for orders placed or performed during the hospital encounter of 01/16/15 (from the past 48 hour(s))  Urinalysis complete, with microscopic [facility] only)     Status: Abnormal   Collection Time: 01/16/15  7:35 PM  Result Value Ref Range   Color, Urine AMBER (A) YELLOW   APPearance CLEAR (A) CLEAR   Glucose, UA NEGATIVE NEGATIVE mg/dL   Bilirubin Urine 2+ (A)  NEGATIVE   Ketones, ur NEGATIVE NEGATIVE mg/dL   Specific Gravity, Urine 1.014 1.005 - 1.030   Hgb urine dipstick 1+ (A) NEGATIVE   pH 5.0 5.0 - 8.0   Protein, ur NEGATIVE NEGATIVE mg/dL   Nitrite NEGATIVE NEGATIVE   Leukocytes, UA NEGATIVE NEGATIVE   RBC / HPF 0-5 0 - 5 RBC/hpf   WBC, UA 0-5 0 - 5 WBC/hpf    Bacteria, UA RARE (A) NONE SEEN   Squamous Epithelial / LPF 6-30 (A) NONE SEEN   Mucous PRESENT   Lipase, blood     Status: Abnormal   Collection Time: 01/16/15  7:36 PM  Result Value Ref Range   Lipase 3198 (H) 22 - 51 U/L  Comprehensive metabolic panel     Status: Abnormal   Collection Time: 01/16/15  7:36 PM  Result Value Ref Range   Sodium 140 135 - 145 mmol/L   Potassium 3.5 3.5 - 5.1 mmol/L   Chloride 104 101 - 111 mmol/L   CO2 25 22 - 32 mmol/L   Glucose, Bld 108 (H) 65 - 99 mg/dL   BUN 10 6 - 20 mg/dL   Creatinine, Ser 0.77 0.44 - 1.00 mg/dL   Calcium 9.2 8.9 - 10.3 mg/dL   Total Protein 8.9 (H) 6.5 - 8.1 g/dL   Albumin 4.1 3.5 - 5.0 g/dL   AST 359 (H) 15 - 41 U/L   ALT 453 (H) 14 - 54 U/L   Alkaline Phosphatase 173 (H) 38 - 126 U/L   Total Bilirubin 7.0 (H) 0.3 - 1.2 mg/dL   GFR calc non Af Amer >60 >60 mL/min   GFR calc Af Amer >60 >60 mL/min    Comment: (NOTE) The eGFR has been calculated using the CKD EPI equation. This calculation has not been validated in all clinical situations. eGFR's persistently <60 mL/min signify possible Chronic Kidney Disease.    Anion gap 11 5 - 15  CBC     Status: None   Collection Time: 01/16/15  7:36 PM  Result Value Ref Range   WBC 9.6 3.6 - 11.0 K/uL   RBC 4.76 3.80 - 5.20 MIL/uL   Hemoglobin 12.9 12.0 - 16.0 g/dL   HCT 39.3 35.0 - 47.0 %   MCV 82.6 80.0 - 100.0 fL   MCH 27.1 26.0 - 34.0 pg   MCHC 32.9 32.0 - 36.0 g/dL   RDW 13.7 11.5 - 14.5 %   Platelets 347 150 - 440 K/uL  Pregnancy, urine POC     Status: None   Collection Time: 01/16/15  7:42 PM  Result Value Ref Range   Preg Test, Ur NEGATIVE NEGATIVE    Comment:        THE SENSITIVITY OF THIS METHODOLOGY IS >24 mIU/mL    US Abdomen Limited Ruq  01/16/2015   CLINICAL DATA:  Acute onset of right upper quadrant abdominal pain. Initial encounter.  EXAM: US ABDOMEN LIMITED - RIGHT UPPER QUADRANT  COMPARISON:  None.  FINDINGS: Gallbladder:  Multiple stones are seen  dependently within the gallbladder, measuring up to 6 mm in size. There is associated mild gallbladder wall thickening, measuring up to 3 mm. However, no ultrasonographic Murphy's sign is elicited. No pericholecystic fluid is seen.  Common bile duct:  Diameter: 0.2 cm, within normal limits in caliber.  Liver:  No focal lesion identified. Within normal limits in parenchymal echogenicity.  IMPRESSION: Cholelithiasis, with mild gallbladder wall thickening. However, no ultrasonographic Murphy's sign is elicited. This may reflect mild  chronic inflammation, without definite evidence for acute cholecystitis or obstruction.   Electronically Signed   By: Garald Balding M.D.   On: 01/16/2015 21:35     Assessment/Plan 24 year old with gallstone pancreatitis, cholelithiasis and choledocholithiasis although CBD size is 69m,  Bilirubin elevated may be from CBD edema.  Will recheck lipase and LFT's in am,  May need MRCP to further evaluate whether CBD stone is present. I will discuss with Dr LRexene Edisonin am as he will be assuming her care in the a.m. There also may be an element of acute cholecystitis given appearance on ultrasound.  Will admit, recheck labs, start unasyn along with dilaudid and anti-emetics.  MHortencia Conradi MD, FAtchisonSurgery

## 2015-01-17 LAB — COMPREHENSIVE METABOLIC PANEL
ALT: 335 U/L — ABNORMAL HIGH (ref 14–54)
ANION GAP: 7 (ref 5–15)
AST: 207 U/L — ABNORMAL HIGH (ref 15–41)
Albumin: 3.7 g/dL (ref 3.5–5.0)
Alkaline Phosphatase: 155 U/L — ABNORMAL HIGH (ref 38–126)
BUN: 9 mg/dL (ref 6–20)
CALCIUM: 8.7 mg/dL — AB (ref 8.9–10.3)
CO2: 28 mmol/L (ref 22–32)
Chloride: 107 mmol/L (ref 101–111)
Creatinine, Ser: 0.88 mg/dL (ref 0.44–1.00)
GFR calc non Af Amer: >60 mL/min (ref >60)
Glucose, Bld: 85 mg/dL (ref 65–99)
Potassium: 3.1 mmol/L — ABNORMAL LOW (ref 3.5–5.1)
SODIUM: 142 mmol/L (ref 135–145)
TOTAL PROTEIN: 7.9 g/dL (ref 6.5–8.1)
Total Bilirubin: 2.3 mg/dL — ABNORMAL HIGH (ref 0.3–1.2)

## 2015-01-17 LAB — CBC
HCT: 37.3 % (ref 35.0–47.0)
HEMOGLOBIN: 12.2 g/dL (ref 12.0–16.0)
MCH: 27.2 pg (ref 26.0–34.0)
MCHC: 32.6 g/dL (ref 32.0–36.0)
MCV: 83.5 fL (ref 80.0–100.0)
Platelets: 338 10*3/uL (ref 150–440)
RBC: 4.47 MIL/uL (ref 3.80–5.20)
RDW: 14 % (ref 11.5–14.5)
WBC: 7.6 10*3/uL (ref 3.6–11.0)

## 2015-01-17 LAB — LIPASE, BLOOD: LIPASE: 282 U/L — AB (ref 22–51)

## 2015-01-17 MED ORDER — SODIUM CHLORIDE 0.9 % IV SOLN
3.0000 g | Freq: Four times a day (QID) | INTRAVENOUS | Status: DC
  Administered 2015-01-17 – 2015-01-20 (×13): 3 g via INTRAVENOUS
  Filled 2015-01-17 (×22): qty 3

## 2015-01-17 MED ORDER — ONDANSETRON HCL 4 MG PO TABS: 4.0000 mg | ORAL_TABLET | Freq: Four times a day (QID) | ORAL | Status: DC | PRN

## 2015-01-17 MED ORDER — HYDROCODONE-ACETAMINOPHEN 5-325 MG PO TABS: 1.0000 | ORAL_TABLET | ORAL | Status: DC | PRN

## 2015-01-17 MED ORDER — PANTOPRAZOLE SODIUM 40 MG IV SOLR
40.0000 mg | Freq: Every day | INTRAVENOUS | Status: DC
  Administered 2015-01-17 – 2015-01-19 (×4): 40 mg via INTRAVENOUS
  Filled 2015-01-17 (×4): qty 40

## 2015-01-17 MED ORDER — ONDANSETRON HCL 4 MG/2ML IJ SOLN
4.0000 mg | Freq: Four times a day (QID) | INTRAMUSCULAR | Status: DC | PRN
  Administered 2015-01-17 – 2015-01-19 (×4): 4 mg via INTRAVENOUS
  Filled 2015-01-17 (×3): qty 2

## 2015-01-17 MED ORDER — ACETAMINOPHEN 650 MG RE SUPP: 650.0000 mg | Freq: Four times a day (QID) | RECTAL | Status: DC | PRN

## 2015-01-17 MED ORDER — ACETAMINOPHEN 325 MG PO TABS: 650.0000 mg | ORAL_TABLET | Freq: Four times a day (QID) | ORAL | Status: DC | PRN

## 2015-01-17 MED ORDER — HYDROMORPHONE HCL 1 MG/ML IJ SOLN
1.0000 mg | INTRAMUSCULAR | Status: DC | PRN
  Administered 2015-01-17 – 2015-01-20 (×11): 1 mg via INTRAVENOUS
  Filled 2015-01-17 (×10): qty 1

## 2015-01-17 MED ORDER — ENOXAPARIN SODIUM 40 MG/0.4ML ~~LOC~~ SOLN
40.0000 mg | SUBCUTANEOUS | Status: DC
  Administered 2015-01-17 – 2015-01-19 (×3): 40 mg via SUBCUTANEOUS
  Filled 2015-01-17 (×3): qty 0.4

## 2015-01-17 MED ORDER — DEXTROSE IN LACTATED RINGERS 5 % IV SOLN
INTRAVENOUS | Status: DC
  Administered 2015-01-17 – 2015-01-19 (×7): via INTRAVENOUS
  Administered 2015-01-19: 1000 mL via INTRAVENOUS
  Administered 2015-01-20: 07:00:00 via INTRAVENOUS

## 2015-01-17 NOTE — Progress Notes (Signed)
   01/17/15 2000  Clinical Encounter Type  Visited With Patient and family together  Visit Type Spiritual support  Spiritual Encounters  Spiritual Needs Emotional  Stress Factors  Patient Stress Factors Health changes   Status: 24 yrs female, Pancreatitis, acute Family: Aunt bedside Faith tradition: no affiliation she said Visit Assessment: The chaplain visited with the patient and her family and introduced pastoral care. The patient seemed jovial.  Pastoral Care: [phone] pager or by online request.

## 2015-01-17 NOTE — Progress Notes (Signed)
Spoke with Dr. Juliann Pulse at patient's request to see if she can have ice chips.  Order received.

## 2015-01-17 NOTE — Progress Notes (Signed)
Initial Nutrition Assessment      INTERVENTION:  Coordination of care: Await diet progression per MD order   NUTRITION DIAGNOSIS:   Inadequate oral intake related to altered GI function as evidenced by NPO status.    GOAL:   Patient will meet greater than or equal to 90% of their needs    MONITOR:    (Energy intake, Digestive system)  REASON FOR ASSESSMENT:   Diagnosis    ASSESSMENT:      Pt admitted with pancreatitis, cholelithasis  Past Medical History  Diagnosis Date  . Pregnant   . Vaginal delivery     Current Nutrition: NPO  Food/Nutrition-Related History: pt reports couple days decreased intake prior to admission but otherwise normal intake   Medications: D5 LR at 168ml/hr, protonix  Electrolyte/Renal Profile and Glucose Profile:   Recent Labs Lab 01/16/15 1936 01/17/15 0634  NA 140 142  K 3.5 3.1*  CL 104 107  CO2 25 28  BUN 10 9  CREATININE 0.77 0.88  CALCIUM 9.2 8.7*  GLUCOSE 108* 85   Protein Profile:  Recent Labs Lab 01/16/15 1936 01/17/15 0634  ALBUMIN 4.1 3.7    Gastrointestinal Profile: Last BM:8/26   Weight Change: stable wt     Diet Order:  Diet NPO time specified Except for: Sips with Meds  Skin:   reviewed   Height:   Ht Readings from Last 1 Encounters:  01/17/15 5' (1.524 m)    Weight:   Wt Readings from Last 1 Encounters:  01/17/15 180 lb (81.647 kg)    BMI:  Body mass index is 35.15 kg/(m^2).   EDUCATION NEEDS:   No education needs identified at this time  LOW Care Level  Mireya Meditz B. Freida Busman, RD, LDN [phone] (pager)

## 2015-01-17 NOTE — Progress Notes (Signed)
Surgery Progress Note  S:  No acute issues.  Feels better.  C/o prior nausea with PO O:Blood pressure 129/82, pulse 70, temperature 97.9 F (36.6 C), temperature source Oral, resp. rate 18, height 5' (1.524 m), weight 180 lb (81.647 kg), last menstrual period 01/16/2015, SpO2 98 %, unknown if currently breastfeeding. GEN: NAD/A&Ox3 ABD; soft, min tender, moderately distended  Lipase 282 Bili 2.3  A/P 24 yo admit with hyperbilirubinemia, pancreatitis, doing well, labs improved - NPO for now - labs in am - no chole until bili closer to normal

## 2015-01-18 LAB — CBC
HEMATOCRIT: 34.2 % — AB (ref 35.0–47.0)
HEMOGLOBIN: 11.2 g/dL — AB (ref 12.0–16.0)
MCH: 27.7 pg (ref 26.0–34.0)
MCHC: 32.8 g/dL (ref 32.0–36.0)
MCV: 84.4 fL (ref 80.0–100.0)
Platelets: 303 10*3/uL (ref 150–440)
RBC: 4.05 MIL/uL (ref 3.80–5.20)
RDW: 13.8 % (ref 11.5–14.5)
WBC: 7.5 10*3/uL (ref 3.6–11.0)

## 2015-01-18 LAB — COMPREHENSIVE METABOLIC PANEL
ALT: 224 U/L — AB (ref 14–54)
AST: 96 U/L — AB (ref 15–41)
Albumin: 3.4 g/dL — ABNORMAL LOW (ref 3.5–5.0)
Alkaline Phosphatase: 144 U/L — ABNORMAL HIGH (ref 38–126)
Anion gap: 6 (ref 5–15)
BILIRUBIN TOTAL: 1.5 mg/dL — AB (ref 0.3–1.2)
BUN: 6 mg/dL (ref 6–20)
CO2: 26 mmol/L (ref 22–32)
CREATININE: 0.68 mg/dL (ref 0.44–1.00)
Calcium: 8.2 mg/dL — ABNORMAL LOW (ref 8.9–10.3)
Chloride: 106 mmol/L (ref 101–111)
GFR calc Af Amer: >60 mL/min (ref >60)
Glucose, Bld: 98 mg/dL (ref 65–99)
Potassium: 3 mmol/L — ABNORMAL LOW (ref 3.5–5.1)
Sodium: 138 mmol/L (ref 135–145)
TOTAL PROTEIN: 6.9 g/dL (ref 6.5–8.1)

## 2015-01-18 LAB — MRSA PCR SCREENING: MRSA by PCR: NEGATIVE

## 2015-01-18 LAB — LIPASE, BLOOD: LIPASE: 35 U/L (ref 22–51)

## 2015-01-18 MED ORDER — POTASSIUM CHLORIDE 10 MEQ/100ML IV SOLN
10.0000 meq | INTRAVENOUS | Status: AC
  Administered 2015-01-18 (×3): 10 meq via INTRAVENOUS
  Filled 2015-01-18 (×3): qty 100

## 2015-01-18 NOTE — Progress Notes (Signed)
Surgery Progress Note  S:  Min pain.  Headache O: Blood pressure 134/90, pulse 78, temperature 98.2 F (36.8 C), temperature source Oral, resp. rate 14, height 5' (1.524 m), weight 180 lb (81.647 kg), last menstrual period 01/16/2015, SpO2 100 %, unknown if currently breastfeeding. GEN: NAD/A&Ox3 ABD: soft, some epigastric tenderness, nondistended  Bili 1.5  A/P 24 yo admitted with hyperbilirubinemia, pancreatitis, bili and lipase improved - would like bili to return to normal prior to chole  - full liquids today - plan lap chole tomorrow

## 2015-01-19 ENCOUNTER — Inpatient Hospital Stay: Payer: 59 | Admitting: Registered Nurse

## 2015-01-19 ENCOUNTER — Inpatient Hospital Stay: Payer: 59

## 2015-01-19 ENCOUNTER — Encounter: Payer: Self-pay | Admitting: Anesthesiology

## 2015-01-19 ENCOUNTER — Encounter
Admission: EM | Disposition: A | Discharge status: Home / Self Care | Payer: Self-pay | Source: Home / Self Care | Attending: Surgery

## 2015-01-19 HISTORY — PX: CHOLECYSTECTOMY: SHX55

## 2015-01-19 LAB — COMPREHENSIVE METABOLIC PANEL
ALBUMIN: 3.5 g/dL (ref 3.5–5.0)
ALT: 158 U/L — ABNORMAL HIGH (ref 14–54)
ANION GAP: 5 (ref 5–15)
AST: 53 U/L — ABNORMAL HIGH (ref 15–41)
Alkaline Phosphatase: 125 U/L (ref 38–126)
BILIRUBIN TOTAL: 1 mg/dL (ref 0.3–1.2)
BUN: <5 mg/dL — ABNORMAL LOW (ref 6–20)
CO2: 27 mmol/L (ref 22–32)
Calcium: 8.4 mg/dL — ABNORMAL LOW (ref 8.9–10.3)
Chloride: 106 mmol/L (ref 101–111)
Creatinine, Ser: 0.73 mg/dL (ref 0.44–1.00)
GFR calc Af Amer: >60 mL/min (ref >60)
Glucose, Bld: 98 mg/dL (ref 65–99)
POTASSIUM: 3.1 mmol/L — AB (ref 3.5–5.1)
Sodium: 138 mmol/L (ref 135–145)
TOTAL PROTEIN: 7.2 g/dL (ref 6.5–8.1)

## 2015-01-19 LAB — LIPASE, BLOOD: LIPASE: 28 U/L (ref 22–51)

## 2015-01-19 SURGERY — LAPAROSCOPIC CHOLECYSTECTOMY WITH INTRAOPERATIVE CHOLANGIOGRAM
Anesthesia: General | Wound class: Clean Contaminated

## 2015-01-19 MED ORDER — ROCURONIUM BROMIDE 100 MG/10ML IV SOLN
INTRAVENOUS | Status: DC | PRN
  Administered 2015-01-19: 40 mg via INTRAVENOUS

## 2015-01-19 MED ORDER — FENTANYL CITRATE (PF) 100 MCG/2ML IJ SOLN
25.0000 ug | INTRAMUSCULAR | Status: DC | PRN
  Administered 2015-01-19 (×4): 25 ug via INTRAVENOUS

## 2015-01-19 MED ORDER — FENTANYL CITRATE (PF) 100 MCG/2ML IJ SOLN
INTRAMUSCULAR | Status: AC
  Filled 2015-01-19: qty 2

## 2015-01-19 MED ORDER — MIDAZOLAM HCL 2 MG/2ML IJ SOLN
INTRAMUSCULAR | Status: DC | PRN
  Administered 2015-01-19: 2 mg via INTRAVENOUS

## 2015-01-19 MED ORDER — NEOSTIGMINE METHYLSULFATE 10 MG/10ML IV SOLN
INTRAVENOUS | Status: DC | PRN
  Administered 2015-01-19: 3 mg via INTRAVENOUS

## 2015-01-19 MED ORDER — GLYCOPYRROLATE 0.2 MG/ML IJ SOLN
INTRAMUSCULAR | Status: DC | PRN
  Administered 2015-01-19: .5 mg via INTRAVENOUS

## 2015-01-19 MED ORDER — FENTANYL CITRATE (PF) 100 MCG/2ML IJ SOLN: 25.0000 ug | INTRAMUSCULAR | Status: DC | PRN

## 2015-01-19 MED ORDER — FENTANYL CITRATE (PF) 100 MCG/2ML IJ SOLN
INTRAMUSCULAR | Status: DC | PRN
  Administered 2015-01-19: 100 ug via INTRAVENOUS
  Administered 2015-01-19 (×2): 50 ug via INTRAVENOUS

## 2015-01-19 MED ORDER — CEFAZOLIN SODIUM-DEXTROSE 2-3 GM-% IV SOLR
2.0000 g | Freq: Once | INTRAVENOUS | Status: AC
Start: 2015-01-19 — End: 2015-01-19
  Administered 2015-01-19: 2 g via INTRAVENOUS
  Filled 2015-01-19: qty 50

## 2015-01-19 MED ORDER — LACTATED RINGERS IV SOLN
INTRAVENOUS | Status: DC | PRN
  Administered 2015-01-19: 11:00:00 via INTRAVENOUS
  Administered 2015-01-19: 13:00:00

## 2015-01-19 MED ORDER — LIDOCAINE HCL (CARDIAC) 20 MG/ML IV SOLN
INTRAVENOUS | Status: DC | PRN
  Administered 2015-01-19: 100 mg via INTRAVENOUS

## 2015-01-19 MED ORDER — LIDOCAINE HCL 1 % IJ SOLN
INTRAMUSCULAR | Status: DC | PRN
  Administered 2015-01-19: 15 mL

## 2015-01-19 MED ORDER — PROPOFOL 10 MG/ML IV BOLUS
INTRAVENOUS | Status: DC | PRN
  Administered 2015-01-19: 140 mg via INTRAVENOUS

## 2015-01-19 MED ORDER — OXYCODONE-ACETAMINOPHEN 5-325 MG PO TABS: 1.0000 | ORAL_TABLET | ORAL | Status: DC | PRN

## 2015-01-19 MED ORDER — DEXAMETHASONE SODIUM PHOSPHATE 4 MG/ML IJ SOLN
INTRAMUSCULAR | Status: DC | PRN
  Administered 2015-01-19: 10 mg via INTRAVENOUS

## 2015-01-19 MED ORDER — KETOROLAC TROMETHAMINE 30 MG/ML IJ SOLN
INTRAMUSCULAR | Status: DC | PRN
  Administered 2015-01-19: 30 mg via INTRAVENOUS

## 2015-01-19 MED ORDER — ONDANSETRON HCL 4 MG/2ML IJ SOLN: 4.0000 mg | Freq: Once | INTRAMUSCULAR | Status: DC | PRN

## 2015-01-19 MED ORDER — IOTHALAMATE MEGLUMINE 60 % INJ SOLN
INTRAMUSCULAR | Status: DC | PRN
  Administered 2015-01-19: 5 mL

## 2015-01-19 MED ORDER — BUPIVACAINE-EPINEPHRINE (PF) 0.25% -1:200000 IJ SOLN
INTRAMUSCULAR | Status: AC
  Filled 2015-01-19: qty 30

## 2015-01-19 SURGICAL SUPPLY — 52 items
APPLIER CLIP ROT 10 11.4 M/L (STAPLE) ×3
BAG COUNTER SPONGE EZ (MISCELLANEOUS) ×2 IMPLANT
BENZOIN TINCTURE PRP APPL 2/3 (GAUZE/BANDAGES/DRESSINGS) ×3 IMPLANT
BLADE SURG SZ11 CARB STEEL (BLADE) ×3 IMPLANT
BULB RESERV EVAC DRAIN JP 100C (MISCELLANEOUS) IMPLANT
CANISTER SUCT 1200ML W/VALVE (MISCELLANEOUS) ×3 IMPLANT
CATH REDDICK CHOLANGI 4FR 50CM (CATHETERS) ×3 IMPLANT
CHLORAPREP W/TINT 26ML (MISCELLANEOUS) ×3 IMPLANT
CLIP APPLIE ROT 10 11.4 M/L (STAPLE) ×1 IMPLANT
CLOSURE WOUND 1/2 X4 (GAUZE/BANDAGES/DRESSINGS) ×1
CONRAY 60ML FOR OR (MISCELLANEOUS) IMPLANT
COUNTER SPONGE BAG EZ (MISCELLANEOUS) ×1
CUTTER LINEAR ENDO 35 ART THIN (STAPLE) ×3 IMPLANT
DISSECTOR KITTNER STICK (MISCELLANEOUS) IMPLANT
DISSECTORS/KITTNER STICK (MISCELLANEOUS)
DRAIN CHANNEL JP 19F (MISCELLANEOUS) IMPLANT
DRAPE SHEET LG 3/4 BI-LAMINATE (DRAPES) ×3 IMPLANT
DRSG TEGADERM 2-3/8X2-3/4 SM (GAUZE/BANDAGES/DRESSINGS) ×12 IMPLANT
DRSG TEGADERM 2X2.25 PEDS (GAUZE/BANDAGES/DRESSINGS) ×3 IMPLANT
DRSG TELFA 3X8 NADH (GAUZE/BANDAGES/DRESSINGS) ×3 IMPLANT
ENDOLOOP SUT PDS II  0 18 (SUTURE)
ENDOLOOP SUT PDS II 0 18 (SUTURE) IMPLANT
ENDOPOUCH RETRIEVER 10 (MISCELLANEOUS) ×3 IMPLANT
GLOVE BIO SURGEON STRL SZ7.5 (GLOVE) ×15 IMPLANT
GOWN STRL REUS W/ TWL LRG LVL3 (GOWN DISPOSABLE) ×3 IMPLANT
GOWN STRL REUS W/TWL LRG LVL3 (GOWN DISPOSABLE) ×6
IRRIGATION STRYKERFLOW (MISCELLANEOUS) ×1 IMPLANT
IRRIGATOR STRYKERFLOW (MISCELLANEOUS) ×3
IV CATH ANGIO 12GX3 LT BLUE (NEEDLE) ×3 IMPLANT
IV NS 1000ML (IV SOLUTION) ×2
IV NS 1000ML BAXH (IV SOLUTION) ×1 IMPLANT
LABEL OR SOLS (LABEL) ×3 IMPLANT
LIQUID BAND (GAUZE/BANDAGES/DRESSINGS) IMPLANT
NEEDLE HYPO 25X1 1.5 SAFETY (NEEDLE) ×3 IMPLANT
NS IRRIG 500ML POUR BTL (IV SOLUTION) ×3 IMPLANT
PACK LAP CHOLECYSTECTOMY (MISCELLANEOUS) ×3 IMPLANT
PAD GROUND ADULT SPLIT (MISCELLANEOUS) ×3 IMPLANT
SCISSORS METZENBAUM CVD 33 (INSTRUMENTS) ×3 IMPLANT
SEAL FOR SCOPE WARMER C3101 (MISCELLANEOUS) ×3 IMPLANT
SLEEVE ENDOPATH XCEL 5M (ENDOMECHANICALS) ×3 IMPLANT
STRAP SAFETY BODY (MISCELLANEOUS) ×3 IMPLANT
STRIP CLOSURE SKIN 1/2X4 (GAUZE/BANDAGES/DRESSINGS) ×2 IMPLANT
SUT MNCRL 4-0 (SUTURE) ×2
SUT MNCRL 4-0 27XMFL (SUTURE) ×1
SUT VICRYL 0 AB UR-6 (SUTURE) ×6 IMPLANT
SUTURE MNCRL 4-0 27XMF (SUTURE) ×1 IMPLANT
SWABSTK COMLB BENZOIN TINCTURE (MISCELLANEOUS) IMPLANT
TROCAR XCEL BLUNT TIP 100MML (ENDOMECHANICALS) ×3 IMPLANT
TROCAR XCEL NON-BLD 11X100MML (ENDOMECHANICALS) ×3 IMPLANT
TROCAR XCEL NON-BLD 5MMX100MML (ENDOMECHANICALS) ×3 IMPLANT
TUBING INSUFFLATOR HI FLOW (MISCELLANEOUS) ×3 IMPLANT
WATER STERILE IRR 1000ML POUR (IV SOLUTION) ×3 IMPLANT

## 2015-01-19 NOTE — Anesthesia Preprocedure Evaluation (Addendum)
Anesthesia Evaluation  Patient identified by MRN, date of birth, ID band Patient awake    Reviewed: Allergy & Precautions, NPO status , Patient's Chart, lab work & pertinent test results  Airway Mallampati: II  TM Distance: >3 FB Neck ROM: Full    Dental  (+) Chipped   Pulmonary neg pulmonary ROS,  breath sounds clear to auscultation  Pulmonary exam normal       Cardiovascular negative cardio ROS Normal cardiovascular exam    Neuro/Psych negative neurological ROS  negative psych ROS   GI/Hepatic Neg liver ROS, Hx of pancreatits   Endo/Other  negative endocrine ROS  Renal/GU negative Renal ROS  negative genitourinary   Musculoskeletal negative musculoskeletal ROS (+)   Abdominal Normal abdominal exam  (+)   Peds negative pediatric ROS (+)  Hematology negative hematology ROS (+)   Anesthesia Other Findings   Reproductive/Obstetrics                            Anesthesia Physical Anesthesia Plan  ASA: II  Anesthesia Plan: General   Post-op Pain Management:    Induction: Intravenous  Airway Management Planned: Oral ETT  Additional Equipment:   Intra-op Plan:   Post-operative Plan: Extubation in OR  Informed Consent: I have reviewed the patients History and Physical, chart, labs and discussed the procedure including the risks, benefits and alternatives for the proposed anesthesia with the patient or authorized representative who has indicated his/her understanding and acceptance.   Dental advisory given  Plan Discussed with: CRNA and Surgeon  Anesthesia Plan Comments:         Anesthesia Quick Evaluation

## 2015-01-19 NOTE — Brief Op Note (Signed)
01/16/2015 - 01/19/2015  12:21 PM  PATIENT:  Kayla Parrish  24 y.o. female  PRE-OPERATIVE DIAGNOSIS:  Cholelithiasis  POST-OPERATIVE DIAGNOSIS:  Cholelithiasis  PROCEDURE:  Procedure(s): LAPAROSCOPIC CHOLECYSTECTOMY WITH INTRAOPERATIVE CHOLANGIOGRAM (N/A)  SURGEON:  Surgeon(s) and Role:    * Ida Rogue, MD - Primary  PHYSICIAN ASSISTANT:   ASSISTANTS: none   ANESTHESIA:   general  EBL:  Total I/O In: -  Out: 700 [Urine:700] 20 ml  BLOOD ADMINISTERED:none  DRAINS: none   LOCAL MEDICATIONS USED:  LIDOCAINE   SPECIMEN:  Excision  DISPOSITION OF SPECIMEN:  PATHOLOGY  COUNTS:  YES  TOURNIQUET:  * No tourniquets in log *  DICTATION: .Note written in EPIC  PLAN OF CARE: Admit for Obs  PATIENT DISPOSITION:  PACU - hemodynamically stable.   Delay start of Pharmacological VTE agent (>24hrs) due to surgical blood loss or risk of bleeding: not applicable

## 2015-01-19 NOTE — Transfer of Care (Signed)
Immediate Anesthesia Transfer of Care Note  Patient: Kayla Parrish  Procedure(s) Performed: Procedure(s): LAPAROSCOPIC CHOLECYSTECTOMY WITH INTRAOPERATIVE CHOLANGIOGRAM (N/A)  Patient Location: PACU  Anesthesia Type:General  Level of Consciousness: awake, alert  and oriented  Airway & Oxygen Therapy: Patient Spontanous Breathing and Patient connected to nasal cannula oxygen  Post-op Assessment: Report given to RN and Post -op Vital signs reviewed and stable  Post vital signs: Reviewed and stable  Last Vitals:  Filed Vitals:   01/19/15 1242  BP: 136/86  Pulse: 71  Temp: 36.5 C  Resp: 6    Complications: No apparent anesthesia complications

## 2015-01-19 NOTE — Progress Notes (Signed)
Consent obtained and CHG bath #1 completed.

## 2015-01-19 NOTE — Anesthesia Postprocedure Evaluation (Signed)
  Anesthesia Post-op Note  Patient: Kayla Parrish  Procedure(s) Performed: Procedure(s): LAPAROSCOPIC CHOLECYSTECTOMY WITH INTRAOPERATIVE CHOLANGIOGRAM (N/A)  Anesthesia type:General  Patient location: PACU  Post pain: Pain level controlled  Post assessment: Post-op Vital signs reviewed, Patient's Cardiovascular Status Stable, Respiratory Function Stable, Patent Airway and No signs of Nausea or vomiting  Post vital signs: Reviewed and stable  Last Vitals:  Filed Vitals:   01/19/15 1242  BP: 136/86  Pulse: 71  Temp: 36.5 C  Resp: 6    Level of consciousness: awake, alert  and patient cooperative  Complications: No apparent anesthesia complications

## 2015-01-19 NOTE — Anesthesia Procedure Notes (Signed)
Procedure Name: Intubation Date/Time: 01/19/2015 11:02 AM Performed by: Peyton Najjar Pre-anesthesia Checklist: Patient identified, Emergency Drugs available, Suction available and Patient being monitored Patient Re-evaluated:Patient Re-evaluated prior to inductionOxygen Delivery Method: Circle system utilized Preoxygenation: Pre-oxygenation with 100% oxygen Intubation Type: IV induction Ventilation: Mask ventilation without difficulty Laryngoscope Size: Mac and 3 Grade View: Grade I Tube type: Oral Number of attempts: 1 Placement Confirmation: ETT inserted through vocal cords under direct vision Secured at: 20 cm Tube secured with: Tape Dental Injury: Teeth and Oropharynx as per pre-operative assessment

## 2015-01-19 NOTE — Op Note (Signed)
Preop dx: Choledocholithiasis, gallstone pancreatitis Postop dx: Same Procedure performed: Laparoscopic cholecystectomy Anesthesia: General EBL: 20 ml Complications: None Specimen: gallbladder  Indication for surgery: Kayla Parrish is a pleasant 24 yo F who presents with pancreatitis and choledocholithiasis.  She was brought to the OR for prophylactic cholecystectomy.    Details of surgery: Informed consent was obtained.  Kayla. Elling was brought to the OR suite and laid supine on the OR table.  She was induced, ETT was placed, general anesthesia was administered.  Her abdomen was prepped and draped.  A timeout was performed correctly identifying patient name, operative site and procedure to be performed.  A supraumbilical incision was made and deepened to the fascia.  The fascia was incised, peritoneum was entered.  Two stay sutures were placed through the fasciotomy.  Hassan trocar was placed and abdomen was insufflated.  An 11mm epigastric and 2 5 mm subcostal trocars were placed.  Gallbladder was mildly.  Cystic duct and cystic artery were dissected out and critical view was obtained.  A proximal (gallbladder) clip was placed on the cystic duct and a cholangiogram was performed.  There was good filling of the biliary tree with good contrast into the duodenum.  No obvious obstructions or filling defects were seen.  Cystic artery was clipped and ligated, cystic duct was mildly enlarged and transected with an endostapler.  Gallbladder was then taken off fossa and removed through umbilicus. Fossa was made hemostatic and irrigated until hemostasis obtained.  Trocars were then removed and abdomen desufflated.  Supraumbilical fascia was then closed with previously placed stay sutures.  Skin was then closed with interrupted deep dermal 4-0 monocryl.  Suture strips, telfa and tegaderm were used to dress incision.  Patient was then awoken, extubated and brought to PACU.  There were no immediate complications. Needle,  sponge and instrument count was correct at the end of the procedure.

## 2015-01-20 LAB — SURGICAL PATHOLOGY

## 2015-01-20 MED ORDER — HYDROCODONE-ACETAMINOPHEN 5-325 MG PO TABS: 1.0000 | ORAL_TABLET | ORAL | Status: DC | PRN

## 2015-01-20 MED ORDER — TRAMADOL HCL 50 MG PO TABS: 100.0000 mg | ORAL_TABLET | Freq: Four times a day (QID) | ORAL | Status: DC | PRN

## 2015-01-20 MED ORDER — TRAMADOL HCL 50 MG PO TABS
100.0000 mg | ORAL_TABLET | Freq: Four times a day (QID) | ORAL | Status: DC | PRN
  Administered 2015-01-20: 100 mg via ORAL
  Filled 2015-01-20: qty 2

## 2015-01-20 NOTE — Progress Notes (Signed)
Surgery Progress Note  S:  Doing well.  Some pain.  Nauseated with narcotics O:Blood pressure 128/80, pulse 72, temperature 97.9 F (36.6 C), temperature source Oral, resp. rate 16, height 5' (1.524 m), weight 187 lb (84.823 kg), last menstrual period 01/16/2015, SpO2 95 %, unknown if currently breastfeeding. GEN: NAD/A&Ox3 ABD: soft, nontender, nondistended   A/P 24 yo s/p lap chole, doing well - pain control - possibly home later

## 2015-01-20 NOTE — Discharge Instructions (Signed)
Do not drive on pain medications °Do not lift greater than 15 lbs for a period of 6 weeks °Call or return to ER if you develop fever greater than 101.5, nausea/vomiting, increased pain, redness/drainage from incisions °Take bandages off in 48 hours.  Okay to shower with bandages on or after they come off, no tub baths °

## 2015-01-20 NOTE — Progress Notes (Signed)
Alert and oriented. Vss. No signs of acute distress. Surgical incision dry and intact. Discharge instructions given. Patient verbalized understanding.

## 2015-01-21 NOTE — Discharge Summary (Signed)
Discharge Diagnosis: Gallstone pancreatitis Choledocholithiasis  Procedure Performed:  Laparoscopic cholecysetctomy with cholangiogram  Exam at discharge: Blood pressure 128/80, pulse 72, temperature 97.9 F (36.6 C), temperature source Oral, resp. rate 16, height 5' (1.524 m), weight 187 lb (84.823 kg), last menstrual period 01/16/2015, SpO2 95 %, unknown if currently breastfeeding. GEN: NAD/A&Ox3 ABD; soft, approp tender, nondistended, incisions c/d/i    Medication List    TAKE these medications        HYDROcodone-acetaminophen 5-325 MG per tablet  Commonly known as:  NORCO/VICODIN  Take 1-2 tablets by mouth every 4 (four) hours as needed for moderate pain.     traMADol 50 MG tablet  Commonly known as:  ULTRAM  Take 2 tablets (100 mg total) by mouth every 6 (six) hours as needed for severe pain.       Discharge Summary: Kayla Parrish was admitted with Epigastric pain, elevated lipase and elevated bilirubin.  Her pain subsided and her laboratory abnormalities returned to normal.  She underwent unremarkable cholecystectomy with cholangiogram which showed no filling abnormalities.  Postop, her diet was advanced to regular and she was transitioned to PO pain meds.  She was discharged in satisfactory condition.

## 2015-01-21 NOTE — Patient Outreach (Signed)
[facility]) Care Management  01/21/2015  Kayla Parrish 07-Nov-1990 [id number]   Referral from [facility],  Assigned [name], RN to outreach for transition of care calls.  Thanks, [name]. [name] [facility] Care Management [facility] CM Assistant Phone: [phone] Fax: [phone]

## 2015-01-26 ENCOUNTER — Other Ambulatory Visit: Payer: Self-pay | Admitting: *Deleted

## 2015-01-26 NOTE — Patient Outreach (Signed)
[facility]) Care Management  01/26/2015  Kayla Parrish 1990-08-01 [id number]   Initial Transition of Care contact  RN spoke briefly with the pt and introduced agency and case manager however pt inidcated not a convenient to time to talk. RN offered to follow up with pt on tomorrow (pt agreed). Will call and attempt to complete a transition of care outreach.   [name], RN Care Management Coordinator [facility] Main Office [phone]

## 2015-01-27 ENCOUNTER — Encounter: Payer: Self-pay | Admitting: Surgery

## 2015-01-27 ENCOUNTER — Other Ambulatory Visit: Payer: Self-pay | Admitting: *Deleted

## 2015-01-27 ENCOUNTER — Ambulatory Visit (INDEPENDENT_AMBULATORY_CARE_PROVIDER_SITE_OTHER): Payer: 59 | Admitting: Surgery

## 2015-01-27 VITALS — BP 136/83 | HR 99 | Temp 98.2°F | Ht 60.0 in | Wt 176.0 lb

## 2015-01-27 DIAGNOSIS — Z09 Encounter for follow-up examination after completed treatment for conditions other than malignant neoplasm: Secondary | ICD-10-CM

## 2015-01-27 NOTE — Patient Instructions (Signed)
Do not lift greater than 15 lbs for a period of 6 weeks Call or return to ER if you develop fever greater than 101.5, nausea/vomiting, increased pain, redness/drainage from incisions  

## 2015-01-27 NOTE — Patient Outreach (Signed)
[facility]) Care Management  01/27/2015  IDALIA ALLBRITTON 1990/06/24 [id number]   Second Transition of care outreach RN attempted to contact the pt has requested on yesterday to follow up today for a more convenient time however unsuccessful. RN able to leave a HIPAA approved voice message and requested a call back. RN will intervene further at that time.   [name], RN Care Management Coordinator [facility] Main Office [phone]

## 2015-01-27 NOTE — Progress Notes (Signed)
Surgery Clinic Note  S: No acute issues.  No pain.  Tolerating diet.  Having good BM O:Blood pressure 136/83, pulse 99, temperature 98.2 F (36.8 C), temperature source Oral, height 5' (1.524 m), weight 176 lb (79.833 kg), last menstrual period 01/16/2015, not currently breastfeeding. GEN: NAD/A&Ox3 ABD: soft, min tender, nondistended, incisions c/d/i  A/P s/p lap chole for gallstone pancreatitis, choledocholithiasis - f/u prn - no heavy lifting x 5 more weeks

## 2015-01-31 ENCOUNTER — Other Ambulatory Visit: Payer: Self-pay | Admitting: *Deleted

## 2015-01-31 NOTE — Patient Outreach (Signed)
[facility]) Care Management  01/31/2015  Kayla Parrish [birth year] [id number]  Second Transition of Care  Note several outreach call made however RN was unable to completed the transition of care until today. RN spoke with pt who indicates she is recovering well with no major issues. Reintroduced [facility] services and inquired further on her ongoing management of care. RN completed the transition of care template and inquired on primary doctor. Pt states she has followed up with the surgeon but does not have a primary doctor. RN offered to assistance along with week transition of care calls over the next few weeks as pt has agreed. Will contact the main [facility] office an inquired on primary doctor taking new patients in [facility]. Strongly encouraged follow up appointments and management of care with following the discharge instructions as indicated on her discharge sheet. Pt appreciative and receptive to the ongoing telephonic assessment and assistance with finding a primary doctor.  Will follow up as needed with the requested information and ongoing transition of care outreach calls.  [name], RN Care Management Coordinator [facility] Main Office [phone]

## 2015-02-07 ENCOUNTER — Ambulatory Visit: Payer: Self-pay | Admitting: *Deleted

## 2015-02-07 ENCOUNTER — Other Ambulatory Visit: Payer: Self-pay | Admitting: *Deleted

## 2015-02-07 NOTE — Patient Outreach (Signed)
[facility]) Care Management  02/07/2015  Kayla Parrish [birth year] [id number]  Third Transition of care contact  RN spoke with pt today and inquired if information was received via [facility] office for finding an available primary provider. Member has indicated she received the requested information. RN strongly encouraged pt to follow up with one of the provider office and inquired on scheduling a new pt status. Pt vebralized an understanding and was receptive to another transition of care contact next week. Pt has indicated no other issues or problems have occurred since our last telephonic conversation. RN has requested to follow up once additional call next week to continue the transition of care. Pt receptive and will be expecting a follow up call next week.   [name], RN Care Management Coordinator [facility] Main Office [phone]

## 2015-02-14 ENCOUNTER — Other Ambulatory Visit: Payer: Self-pay | Admitting: *Deleted

## 2015-02-14 ENCOUNTER — Ambulatory Visit: Payer: 59 | Admitting: *Deleted

## 2015-02-14 NOTE — Patient Outreach (Addendum)
[facility]) Care Management  02/14/2015  Kayla Parrish 1991-03-14 [id number]   Transition of care fourth contact.  RN spoke with pt today who indicates she received the physicians list of providers and decided which provider she is interested in and will contact them today and schedule the initial office visit. Pt grateful and continues to do well with her ongiong recovery form [facility].By providing a list of providers in the area pt can contact her new provider and avoid ED visits if her issues are not acute or emergent at the time.  No other issues, inquires or request at this time as RN will close this case from further telephonic inquires as pt is self managing her issues accordingly with the provided resources.  [name], RN Care Management Coordinator [facility] Main Office [phone]

## 2015-02-16 ENCOUNTER — Telehealth: Payer: Self-pay | Admitting: Surgery

## 2015-02-16 NOTE — Telephone Encounter (Signed)
Scott Rogers Blocker called back. He was seeking clarification as to whether the patient could return to light duty with the 15 lb lifting restriction. The patient's employer has a position that would work within the 15 lb lifting restriction until the patient returns to full duty on 03/02/15. I informed Mr Rogers Blocker that if the patient was physically able to return to work abiding by the 15 lb lifting restriction and no extended periods of standing that she should be okay to return to work. Mr Rogers Blocker confirmed the information and direction given.

## 2015-02-16 NOTE — Patient Outreach (Signed)
[facility]) Care Management  02/16/2015  HOMER PFEIFER 04-Aug-1990 [id number]   Notification from [name], RN to close case due to not further concerns for [facility] Care Management.  Thanks, [name]. [name] [facility] Care Management [facility] CM Assistant Phone: [phone] Fax: [phone]

## 2015-02-16 NOTE — Telephone Encounter (Signed)
Left message on voice mail stating that the patient can return to work on 03/02/15 with no restrictions as per her disability certificate written by Dr Juliann Pulse.

## 2015-02-16 NOTE — Telephone Encounter (Signed)
Call Corliss Blacker at [phone] x [phone] Can pt return to work with a 15 lb lifting restriction? ([employer])

## 2015-05-12 ENCOUNTER — Encounter: Payer: Self-pay | Admitting: Family Medicine

## 2015-05-12 ENCOUNTER — Ambulatory Visit (INDEPENDENT_AMBULATORY_CARE_PROVIDER_SITE_OTHER): Payer: 59 | Admitting: Family Medicine

## 2015-05-12 VITALS — BP 126/86 | HR 83 | Temp 98.2°F | Resp 16 | Ht 60.0 in | Wt 185.8 lb

## 2015-05-12 DIAGNOSIS — Z7189 Other specified counseling: Secondary | ICD-10-CM

## 2015-05-12 DIAGNOSIS — K219 Gastro-esophageal reflux disease without esophagitis: Secondary | ICD-10-CM | POA: Diagnosis not present

## 2015-05-12 DIAGNOSIS — Z8349 Family history of other endocrine, nutritional and metabolic diseases: Secondary | ICD-10-CM

## 2015-05-12 DIAGNOSIS — Z823 Family history of stroke: Secondary | ICD-10-CM

## 2015-05-12 DIAGNOSIS — R030 Elevated blood-pressure reading, without diagnosis of hypertension: Secondary | ICD-10-CM | POA: Diagnosis not present

## 2015-05-12 DIAGNOSIS — Z7689 Persons encountering health services in other specified circumstances: Secondary | ICD-10-CM

## 2015-05-12 DIAGNOSIS — IMO0001 Reserved for inherently not codable concepts without codable children: Secondary | ICD-10-CM

## 2015-05-12 MED ORDER — RANITIDINE HCL 150 MG PO CAPS: 150.0000 mg | ORAL_CAPSULE | Freq: Two times a day (BID) | ORAL | Status: DC

## 2015-05-12 NOTE — Patient Instructions (Addendum)
Your goal blood pressure is 140/90. Work on low salt/sodium diet - goal <1.5gm (1,500mg ) per day. Eat a diet high in fruits/vegetables and whole grains.  Look into mediterranean and DASH diet. Goal activity is 15550min/wk of moderate intensity exercise.  This can be split into 30 minute chunks.  If you are not at this level, you can start with smaller 10-15 min increments and slowly build up activity. Look at www.heart.org for more resources   Please seek immediate medical attention at ER or Urgent Care if you develop: Chest pain, pressure or tightness. Shortness of breath accompanied by nausea or diaphoresis Visual changes Numbness or tingling on one side of the body Facial droop Altered mental status Or any concerning symptoms.  Gastroesophageal Reflux Disease, Adult Normally, food travels down the esophagus and stays in the stomach to be digested. However, when a person has gastroesophageal reflux disease (GERD), food and stomach acid move back up into the esophagus. When this happens, the esophagus becomes sore and inflamed. Over time, GERD can create small holes (ulcers) in the lining of the esophagus.  CAUSES This condition is caused by a problem with the muscle between the esophagus and the stomach (lower esophageal sphincter, or LES). Normally, the LES muscle closes after food passes through the esophagus to the stomach. When the LES is weakened or abnormal, it does not close properly, and that allows food and stomach acid to go back up into the esophagus. The LES can be weakened by certain dietary substances, medicines, and medical conditions, including:  Tobacco use.  Pregnancy.  Having a hiatal hernia.  Heavy alcohol use.  Certain foods and beverages, such as coffee, chocolate, onions, and peppermint. RISK FACTORS This condition is more likely to develop in:  People who have an increased body weight.  People who have connective tissue disorders.  People who use NSAID  medicines. SYMPTOMS Symptoms of this condition include:  Heartburn.  Difficult or painful swallowing.  The feeling of having a lump in the throat.  Abitter taste in the mouth.  Bad breath.  Having a large amount of saliva.  Having an upset or bloated stomach.  Belching.  Chest pain.  Shortness of breath or wheezing.  Ongoing (chronic) cough or a night-time cough.  Wearing away of tooth enamel.  Weight loss. Different conditions can cause chest pain. Make sure to see your health care provider if you experience chest pain. DIAGNOSIS Your health care provider will take a medical history and perform a physical exam. To determine if you have mild or severe GERD, your health care provider may also monitor how you respond to treatment. You may also have other tests, including:  An endoscopy toexamine your stomach and esophagus with a small camera.  A test thatmeasures the acidity level in your esophagus.  A test thatmeasures how much pressure is on your esophagus.  A barium swallow or modified barium swallow to show the shape, size, and functioning of your esophagus. TREATMENT The goal of treatment is to help relieve your symptoms and to prevent complications. Treatment for this condition may vary depending on how severe your symptoms are. Your health care provider may recommend:  Changes to your diet.  Medicine.  Surgery. HOME CARE INSTRUCTIONS Diet  Follow a diet as recommended by your health care provider. This may involve avoiding foods and drinks such as:  Coffee and tea (with or without caffeine).  Drinks that containalcohol.  Energy drinks and sports drinks.  Carbonated drinks or sodas.  Chocolate  and cocoa.  Peppermint and mint flavorings.  Garlic and onions.  Horseradish.  Spicy and acidic foods, including peppers, chili powder, curry powder, vinegar, hot sauces, and barbecue sauce.  Citrus fruit juices and citrus fruits, such as oranges,  lemons, and limes.  Tomato-based foods, such as red sauce, chili, salsa, and pizza with red sauce.  Fried and fatty foods, such as donuts, french fries, potato chips, and high-fat dressings.  High-fat meats, such as hot dogs and fatty cuts of red and white meats, such as rib eye steak, sausage, ham, and bacon.  High-fat dairy items, such as whole milk, butter, and cream cheese.  Eat small, frequent meals instead of large meals.  Avoid drinking large amounts of liquid with your meals.  Avoid eating meals during the 2-3 hours before bedtime.  Avoid lying down right after you eat.  Do not exercise right after you eat. General Instructions  Pay attention to any changes in your symptoms.  Take over-the-counter and prescription medicines only as told by your health care provider. Do not take aspirin, ibuprofen, or other NSAIDs unless your health care provider told you to do so.  Do not use any tobacco products, including cigarettes, chewing tobacco, and e-cigarettes. If you need help quitting, ask your health care provider.  Wear loose-fitting clothing. Do not wear anything tight around your waist that causes pressure on your abdomen.  Raise (elevate) the head of your bed 6 inches (15cm).  Try to reduce your stress, such as with yoga or meditation. If you need help reducing stress, ask your health care provider.  If you are overweight, reduce your weight to an amount that is healthy for you. Ask your health care provider for guidance about a safe weight loss goal.  Keep all follow-up visits as told by your health care provider. This is important. SEEK MEDICAL CARE IF:  You have new symptoms.  You have unexplained weight loss.  You have difficulty swallowing, or it hurts to swallow.  You have wheezing or a persistent cough.  Your symptoms do not improve with treatment.  You have a hoarse voice. SEEK IMMEDIATE MEDICAL CARE IF:  You have pain in your arms, neck, jaw,  teeth, or back.  You feel sweaty, dizzy, or light-headed.  You have chest pain or shortness of breath.  You vomit and your vomit looks like blood or coffee grounds.  You faint.  Your stool is bloody or black.  You cannot swallow, drink, or eat.   This information is not intended to replace advice given to you by your health care provider. Make sure you discuss any questions you have with your health care provider.   Document Released: 02/14/2005 Document Revised: 01/26/2015 Document Reviewed: 09/01/2014 Elsevier Interactive Patient Education [employer]

## 2015-05-12 NOTE — Progress Notes (Signed)
Subjective:    Patient ID: Kayla SaintWhitney N Laguardia, female    DOB: 02/22/1991, 24 y.o.   MRN: [id number]  HPI: Kayla Parrish is a 24 y.o. female presenting on 05/12/2015 for Establish Care   HPI  Pt presents to establish care today. [facility].  It has been 6 months since Her last PCP visit. Records from previous provider will be requested and reviewed. Current medical problems include:  Gall bladder removed 2/2 pregnancy in August.  Had issues with BP during pregnancy.  BP has been borderline since.  Has had belching and discomfort after meals. Some issues with acid reflux. No blood in the vomit, no regurg, or dysphagia.    Health maintenance:  Last TDAP: 2015 Pap April 2016- history of abnormal pap- LEEP procedure 5 years ago. Unsure if tested for HPV. Never had gardasil.   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 Pregnant   . Vaginal delivery   . Cholelithiases 01/16/2015  . Pancreatitis, acute 01/16/2015  . Acute gallstone pancreatitis    Social History   Social History  . Marital Status: Married    Spouse Name: N/A  . Number of Children: N/A  . Years of Education: N/A   Occupational History  . Not on file.   Social History Main Topics  . Smoking status: Never Smoker   . Smokeless tobacco: Never Used  . Alcohol Use: Yes  . Drug Use: No  . Sexual Activity: Yes    Birth Control/ Protection: IUD   Other Topics Concern  . Not on file   Social History Narrative   Family History  Problem Relation Age of Onset  . Gallbladder disease Neg Hx   . Thyroid disease Mother   . Hypothyroidism Mother   . Hypertension Paternal Grandmother   . Heart disease Paternal Grandmother   . Diabetes Paternal Grandmother   . Kidney disease Paternal Grandmother    No current outpatient prescriptions on file prior to visit.   No current facility-administered medications on file prior to visit.      Review of Systems  Constitutional: Negative for fever and chills.  HENT: Negative.   Respiratory: Negative for cough, chest tightness and wheezing.   Cardiovascular: Negative for chest pain and leg swelling.  Gastrointestinal: Negative for nausea, vomiting, abdominal pain, diarrhea and constipation.  Endocrine: Negative.  Negative for cold intolerance, heat intolerance, polydipsia, polyphagia and polyuria.  Genitourinary: Negative for dysuria and difficulty urinating.  Musculoskeletal: Negative.   Neurological: Negative for dizziness, light-headedness and numbness.  Psychiatric/Behavioral: Negative.    Per HPI unless specifically indicated above     Objective:    BP 126/86 mmHg  Pulse 83  Temp(Src) 98.2 F (36.8 C) (Oral)  Resp 16  Ht 5' (1.524 m)  Wt 185 lb 12.8 oz (84.278 kg)  BMI 36.29 kg/m2  LMP  (LMP Unknown)  Wt Readings from Last 3 Encounters:  05/12/15 185 lb 12.8 oz (84.278 kg)  01/27/15 176 lb (79.833 kg)  01/20/15 187 lb (84.823 kg)    Physical Exam  Constitutional: She is oriented to person, place, and time. She appears well-developed and well-nourished.  HENT:  Head: Normocephalic and atraumatic.  Neck: Neck supple.  Cardiovascular: Normal rate, regular rhythm and normal heart sounds.  Exam reveals no gallop and no friction rub.   No murmur heard. Pulmonary/Chest: Effort normal and breath sounds normal. She has no  wheezes. She exhibits no tenderness.  Abdominal: Soft. Normal appearance and bowel sounds are normal. She exhibits no distension and no mass. There is no tenderness. There is no rebound and no guarding.  Musculoskeletal: Normal range of motion. She exhibits no edema or tenderness.  Lymphadenopathy:    She has no cervical adenopathy.  Neurological: She is alert and oriented to person, place, and time.  Skin: Skin is warm and dry.   Results for orders placed or performed in visit on 05/12/15  HM PAP SMEAR  Result Value Ref Range   HM Pap  smear normal       Assessment & Plan:   Problem List Items Addressed This Visit      Digestive   Gastroesophageal reflux disease without esophagitis    Trial of zatanc BID to control GERD symptoms. Consider PPI if not helping. Reviewed alarm symptoms with patient.  RTC 2 mos.       Relevant Medications   ranitidine (ZANTAC) 150 MG capsule    Other Visit Diagnoses    Family history of stroke    -  Primary    check baseline lipid panel.     Relevant Orders    Lipid Profile    Family history of thyroid disease        check TSH at baseline    Relevant Orders    TSH    Elevated BP        BP WNL at the office. Discussed increasing exercise to reduce blood pressure.  Wieght loss and exercise will help control BP. No need for medication at this tim    Relevant Orders    Comprehensive Metabolic Panel (CMET)    Encounter to establish care           Meds ordered this encounter  Medications  . levonorgestrel (MIRENA) 20 MCG/24HR IUD    Sig: 1 each by Intrauterine route once.  . ranitidine (ZANTAC) 150 MG capsule    Sig: Take 1 capsule (150 mg total) by mouth 2 (two) times daily.    Dispense:  60 capsule    Refill:  11    Order Specific Question:  Supervising Provider    Answer:  Janeann Forehand [phone]      Follow up plan: Return in about 2 months (around 07/13/2015) for Acid reflux.

## 2015-05-12 NOTE — Assessment & Plan Note (Signed)
Trial of zatanc BID to control GERD symptoms. Consider PPI if not helping. Reviewed alarm symptoms with patient.  RTC 2 mos.

## 2015-05-20 LAB — LIPID PANEL
CHOL/HDL RATIO: 3.8 ratio (ref 0.0–4.4)
Cholesterol, Total: 145 mg/dL (ref 100–199)
HDL: 38 mg/dL — AB (ref >39)
LDL Calculated: 90 mg/dL (ref 0–99)
Triglycerides: 87 mg/dL (ref 0–149)
VLDL CHOLESTEROL CAL: 17 mg/dL (ref 5–40)

## 2015-05-20 LAB — COMPREHENSIVE METABOLIC PANEL
A/G RATIO: 1.2 (ref 1.1–2.5)
ALBUMIN: 4.3 g/dL (ref 3.5–5.5)
ALT: 15 IU/L (ref 0–32)
AST: 19 IU/L (ref 0–40)
Alkaline Phosphatase: 81 IU/L (ref 39–117)
BILIRUBIN TOTAL: 0.6 mg/dL (ref 0.0–1.2)
BUN / CREAT RATIO: 8 (ref 8–20)
BUN: 6 mg/dL (ref 6–20)
CALCIUM: 8.8 mg/dL (ref 8.7–10.2)
CO2: 23 mmol/L (ref 18–29)
Chloride: 103 mmol/L (ref 96–106)
Creatinine, Ser: 0.75 mg/dL (ref 0.57–1.00)
GFR, EST AFRICAN AMERICAN: 127 mL/min/{1.73_m2} (ref >59)
GFR, EST NON AFRICAN AMERICAN: 110 mL/min/{1.73_m2} (ref >59)
Globulin, Total: 3.5 g/dL (ref 1.5–4.5)
Glucose: 78 mg/dL (ref 65–99)
POTASSIUM: 4 mmol/L (ref 3.5–5.2)
Sodium: 139 mmol/L (ref 134–144)
TOTAL PROTEIN: 7.8 g/dL (ref 6.0–8.5)

## 2015-05-20 LAB — TSH: TSH: 1.6 u[IU]/mL (ref 0.450–4.500)

## 2015-08-30 DIAGNOSIS — Z30431 Encounter for routine checking of intrauterine contraceptive device: Secondary | ICD-10-CM | POA: Diagnosis not present

## 2015-08-30 DIAGNOSIS — Z01419 Encounter for gynecological examination (general) (routine) without abnormal findings: Secondary | ICD-10-CM | POA: Diagnosis not present

## 2015-09-09 IMAGING — CR DG CHOLANGIOGRAM OPERATIVE
1 series · 15 of 16 positions shown · non-contrast
Comparison: Ultrasound 01/16/2015

CLINICAL DATA: Cholelithiasis

EXAM:
INTRAOPERATIVE CHOLANGIOGRAM
TECHNIQUE: Cholangiographic images from the C-arm fluoroscopic device were
submitted for interpretation post-operatively. Please see the
procedural report for the amount of contrast and the fluoroscopy
time utilized.

[Series 6001: (person_name) · 15 of 45 slices shown]
[im 1/45]
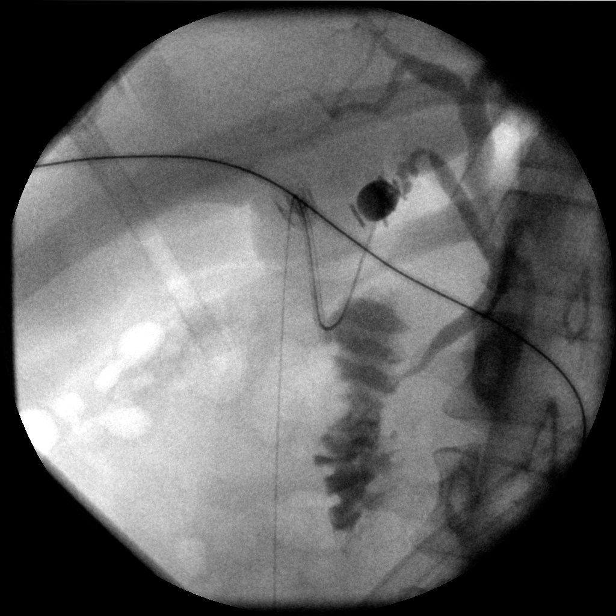
[im 3/45]
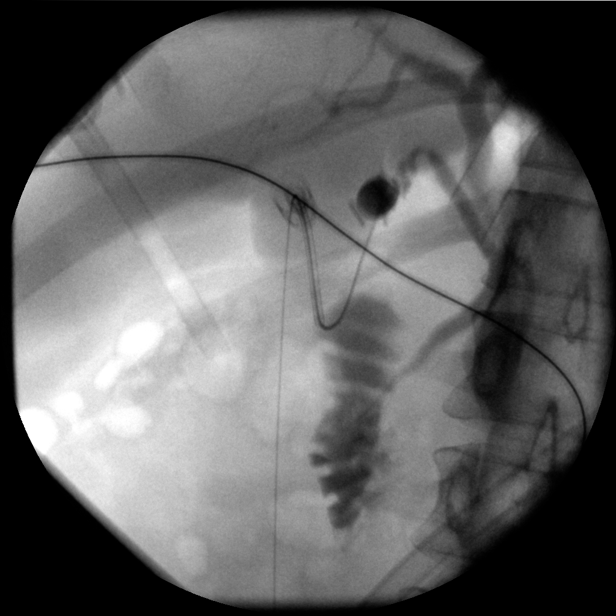
[im 6/45]
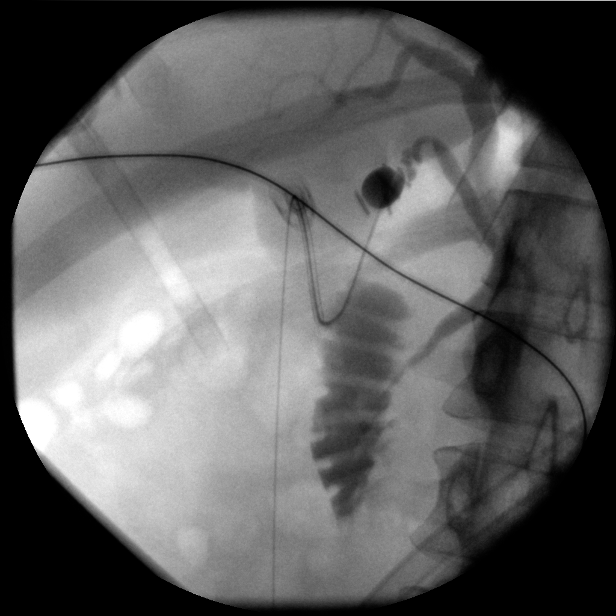
[im 9/45]
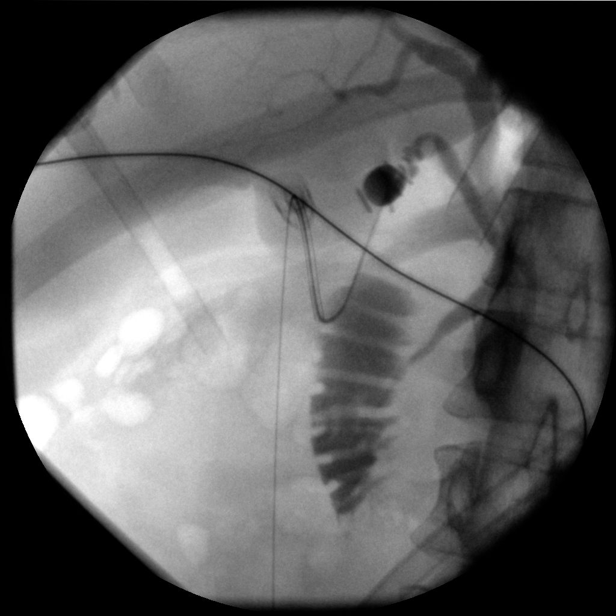
[im 12/45]
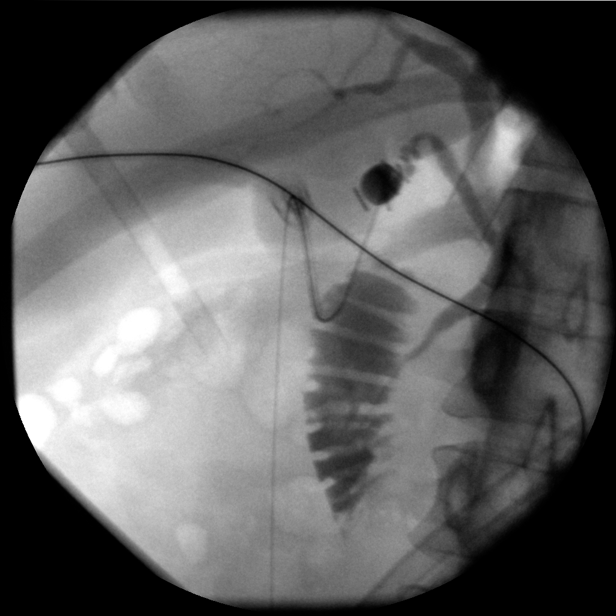
[im 15/45]
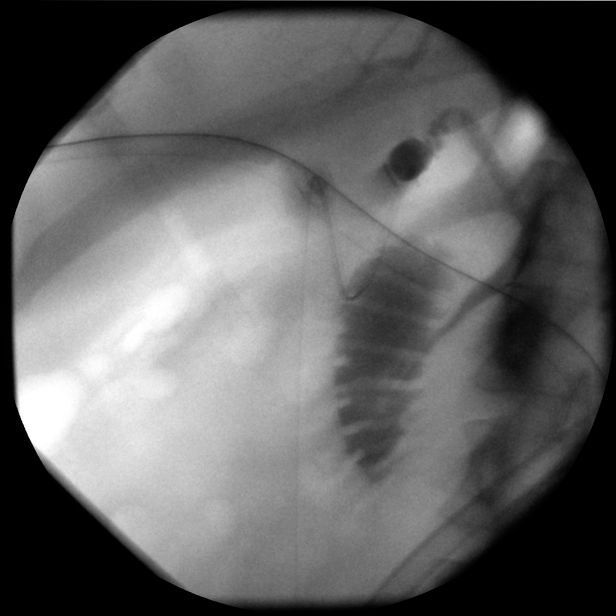
[im 18/45]
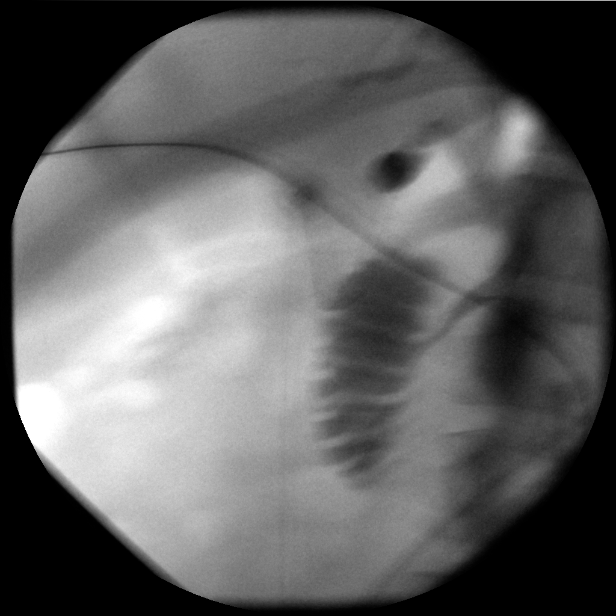
[im 24/45]
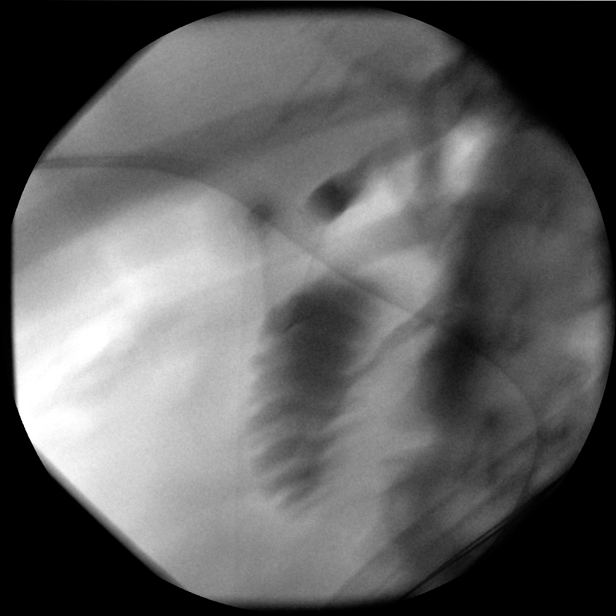
[im 27/45]
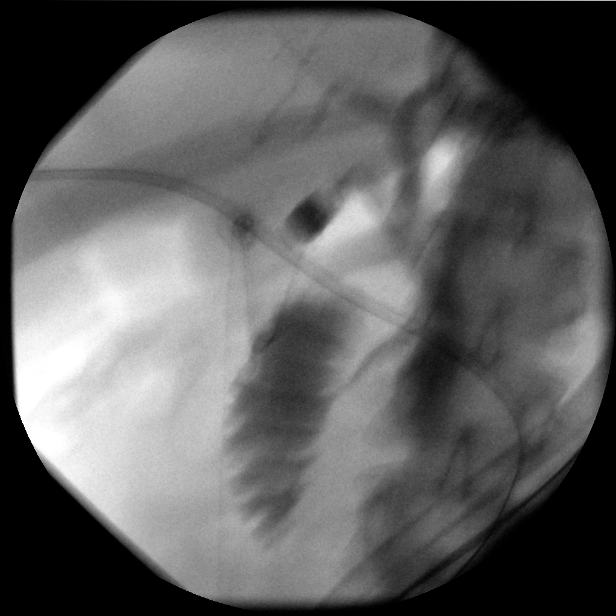
[im 30/45]
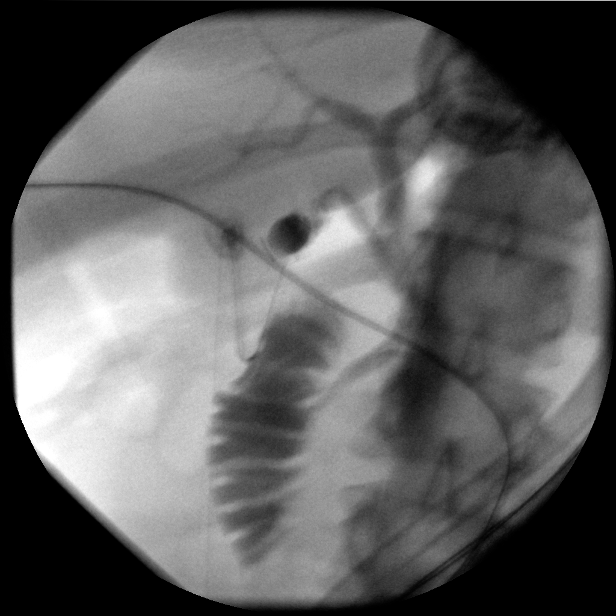
[im 33/45]
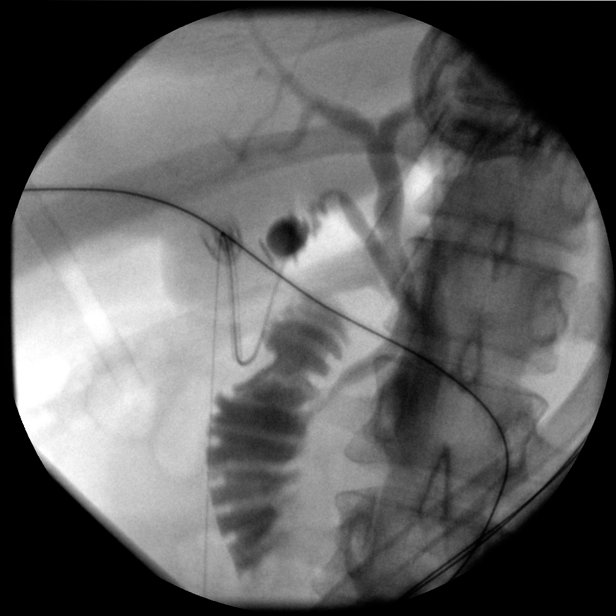
[im 36/45]
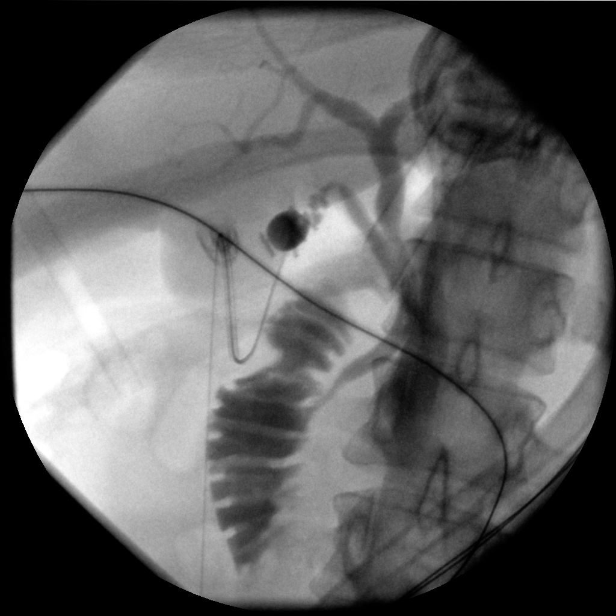
[im 39/45]
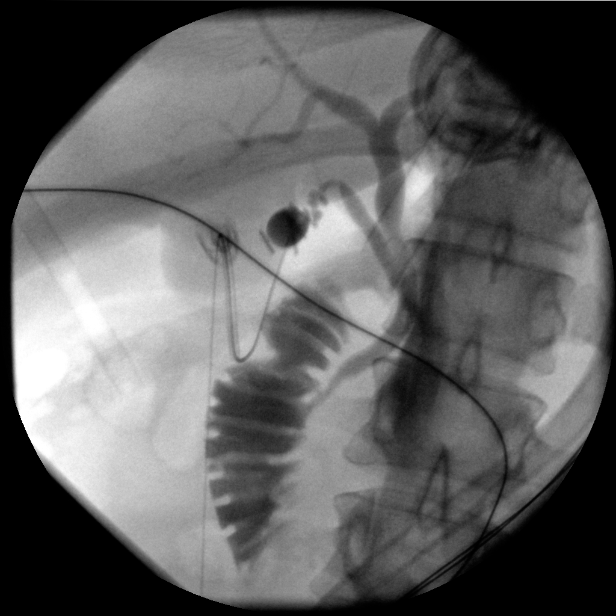
[im 42/45]
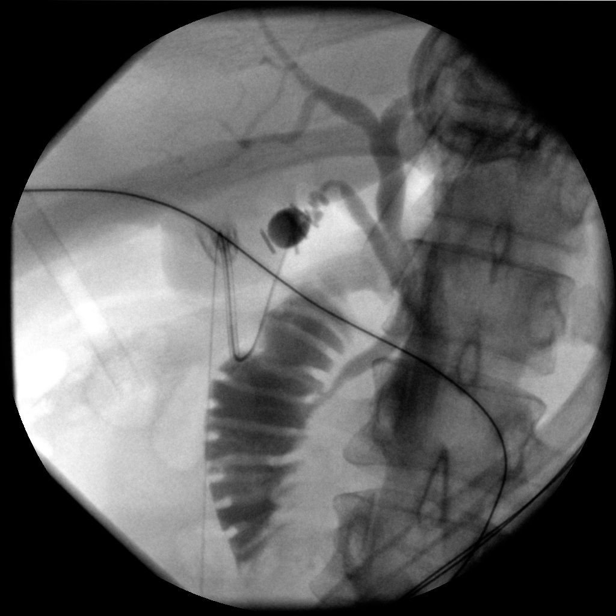
[im 45/45]
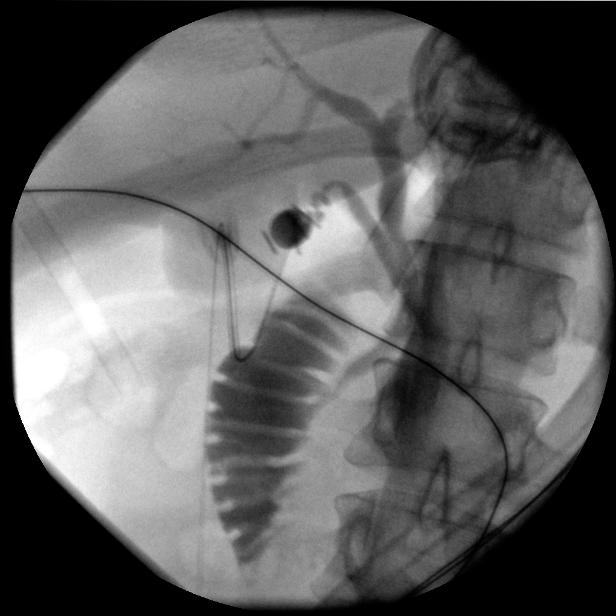

[15 of 16 positions shown; findings below may reference images not displayed]

FINDINGS: No persistent filling defects in the common duct. Intrahepatic ducts
are incompletely visualized, appearing decompressed centrally.
Contrast passes into the duodenum.

:
Negative for retained common duct stone.

## 2015-11-25 ENCOUNTER — Ambulatory Visit (INDEPENDENT_AMBULATORY_CARE_PROVIDER_SITE_OTHER): Payer: 59 | Admitting: Family Medicine

## 2015-11-25 VITALS — BP 140/89 | HR 86 | Temp 98.4°F | Resp 16 | Ht 60.0 in | Wt 181.0 lb

## 2015-11-25 DIAGNOSIS — J040 Acute laryngitis: Secondary | ICD-10-CM | POA: Diagnosis not present

## 2015-11-25 DIAGNOSIS — J069 Acute upper respiratory infection, unspecified: Secondary | ICD-10-CM | POA: Diagnosis not present

## 2015-11-25 MED ORDER — DM-GUAIFENESIN ER 30-600 MG PO TB12: 1.0000 | ORAL_TABLET | Freq: Two times a day (BID) | ORAL | Status: DC

## 2015-11-25 MED ORDER — BENZONATATE 100 MG PO CAPS
100.0000 mg | ORAL_CAPSULE | Freq: Three times a day (TID) | ORAL | Status: DC | PRN
Start: 2015-11-25 — End: 2016-06-13

## 2015-11-25 MED ORDER — AMOXICILLIN-POT CLAVULANATE 875-125 MG PO TABS: 1.0000 | ORAL_TABLET | Freq: Two times a day (BID) | ORAL | Status: DC

## 2015-11-25 MED ORDER — PSEUDOEPHEDRINE HCL 60 MG PO TABS: 60.0000 mg | ORAL_TABLET | Freq: Three times a day (TID) | ORAL | Status: DC | PRN

## 2015-11-25 NOTE — Progress Notes (Signed)
Subjective:    Patient ID: Kayla Parrish, female    DOB: 1990-12-17, 25 y.o.   MRN: [id number]  HPI: Kayla SaintWhitney N Haithcock is a 25 y.o. female presenting on 11/25/2015 for Laryngitis   HPI  Pt presents for laryngitis. Symptoms started on Wednesday with sore throat. Yesterday began coughing. No fevers or shortness of breath. Now having sinus pressure. Frontal sinus pain. Clear sputum.  Home treatment: Benadryl. Theraflu- cough and congestion.    Past Medical History  Diagnosis Date  . Pregnant   . Vaginal delivery   . Cholelithiases 01/16/2015  . Pancreatitis, acute 01/16/2015  . Acute gallstone pancreatitis     Current Outpatient Prescriptions on File Prior to Visit  Medication Sig  . levonorgestrel (MIRENA) 20 MCG/24HR IUD 1 each by Intrauterine route once.  . ranitidine (ZANTAC) 150 MG capsule Take 1 capsule (150 mg total) by mouth 2 (two) times daily.   No current facility-administered medications on file prior to visit.    Review of Systems  Constitutional: Positive for chills.  HENT: Positive for congestion and sinus pressure. Negative for ear pain, sneezing and sore throat.   Respiratory: Negative for cough, chest tightness and wheezing.   Cardiovascular: Negative for chest pain, palpitations and leg swelling.  Gastrointestinal: Negative.  Negative for nausea, vomiting and diarrhea.  Musculoskeletal: Positive for neck pain.  Neurological: Positive for headaches.   Per HPI unless specifically indicated above     Objective:    BP 140/89 mmHg  Pulse 86  Temp(Src) 98.4 F (36.9 C) (Oral)  Resp 16  Ht 5' (1.524 m)  Wt 181 lb (82.101 kg)  BMI 35.35 kg/m2  SpO2 98%  Wt Readings from Last 3 Encounters:  11/25/15 181 lb (82.101 kg)  05/12/15 185 lb 12.8 oz (84.278 kg)  01/27/15 176 lb (79.833 kg)    Physical Exam Results for orders placed or performed in visit on 05/12/15  Comprehensive Metabolic Panel (CMET)  Result Value Ref Range   Glucose 78 65 - 99 mg/dL   BUN 6 6 - 20 mg/dL   Creatinine, Ser 6.210.75 0.57 - 1.00 mg/dL   GFR calc non Af Amer 110 >59 mL/min/1.73   GFR calc Af Amer 127 >59 mL/min/1.73   BUN/Creatinine Ratio 8 8 - 20   Sodium 139 134 - 144 mmol/L   Potassium 4.0 3.5 - 5.2 mmol/L   Chloride 103 96 - 106 mmol/L   CO2 23 18 - 29 mmol/L   Calcium 8.8 8.7 - 10.2 mg/dL   Total Protein 7.8 6.0 - 8.5 g/dL   Albumin 4.3 3.5 - 5.5 g/dL   Globulin, Total 3.5 1.5 - 4.5 g/dL   Albumin/Globulin Ratio 1.2 1.1 - 2.5   Bilirubin Total 0.6 0.0 - 1.2 mg/dL   Alkaline Phosphatase 81 39 - 117 IU/L   AST 19 0 - 40 IU/L   ALT 15 0 - 32 IU/L  Lipid Profile  Result Value Ref Range   Cholesterol, Total 145 100 - 199 mg/dL   Triglycerides 87 0 - 149 mg/dL   HDL 38 (L) >30>39 mg/dL   VLDL Cholesterol Cal 17 5 - 40 mg/dL   LDL Calculated 90 0 - 99 mg/dL   Chol/HDL Ratio 3.8 0.0 - 4.4 ratio units  TSH  Result Value Ref Range   TSH 1.600 0.450 - 4.500 uIU/mL  HM PAP SMEAR  Result Value Ref Range   HM Pap smear normal       Assessment & Plan:  Problem List Items Addressed This Visit    None    Visit Diagnoses    Upper respiratory infection    -  Primary    Likely viral etiology. Supportive care at this time Paper script for augmentin given in setting severe sinus pain. Will fill as needed. Alarm symp reviewed.     Relevant Medications    benzonatate (TESSALON) 100 MG capsule    dextromethorphan-guaiFENesin (MUCINEX DM) 30-600 MG 12hr tablet    pseudoephedrine (SUDAFED) 60 MG tablet    amoxicillin-clavulanate (AUGMENTIN) 875-125 MG tablet    Laryngitis        Likely viral etiology. Supportive care at home.        Meds ordered this encounter  Medications  . benzonatate (TESSALON) 100 MG capsule    Sig: Take 1 capsule (100 mg total) by mouth 3 (three) times daily as needed.    Dispense:  30 capsule    Refill:  0    Order Specific Question:  Supervising Provider    Answer:  Janeann ForehandHAWKINS JR, JAMES H [[id number]]  . dextromethorphan-guaiFENesin  (MUCINEX DM) 30-600 MG 12hr tablet    Sig: Take 1 tablet by mouth 2 (two) times daily.    Dispense:  20 tablet    Refill:  0    Order Specific Question:  Supervising Provider    Answer:  Janeann ForehandHAWKINS JR, JAMES H [phone][[id number]]  . pseudoephedrine (SUDAFED) 60 MG tablet    Sig: Take 1 tablet (60 mg total) by mouth every 8 (eight) hours as needed for congestion.    Dispense:  30 tablet    Refill:  0    Order Specific Question:  Supervising Provider    Answer:  Janeann ForehandHAWKINS JR, JAMES H [phone][[id number]]  . amoxicillin-clavulanate (AUGMENTIN) 875-125 MG tablet    Sig: Take 1 tablet by mouth 2 (two) times daily.    Dispense:  10 tablet    Refill:  0    Order Specific Question:  Supervising Provider    Answer:  Janeann ForehandHAWKINS JR, JAMES H [[id number]]      Follow up plan: Return if symptoms worsen or fail to improve.

## 2015-11-25 NOTE — Patient Instructions (Signed)
Your symptoms are consistent with a viral upper respiratory infection. At this time there is no need for antibiotics.  If your symptoms persist for > 10 days or get better and than worsen please let me know. You may have a secondary bacterial infection.  You can use supportive care at home to help with your symptoms. I have sent Mucinex DM to your pharmacy to help break up the congestion and soothe your cough. You can takes this twice daily.  I have also sent tesslon perles to your pharmacy to help with the cough- you can take these 3 times daily as needed. Honey is a natural cough suppressant- so add it to your tea in the morning.  If you have a humidifer, set that up in your bedroom at night.   For laryngitis- gargle warm slat water 3-4 times daily. Use hot tea and lemon to help with sore throat.

## 2015-12-06 ENCOUNTER — Telehealth: Payer: Self-pay | Admitting: Family Medicine

## 2015-12-06 DIAGNOSIS — B373 Candidiasis of vulva and vagina: Secondary | ICD-10-CM

## 2015-12-06 DIAGNOSIS — B3731 Acute candidiasis of vulva and vagina: Secondary | ICD-10-CM

## 2015-12-06 MED ORDER — FLUCONAZOLE 150 MG PO TABS: 150.0000 mg | ORAL_TABLET | Freq: Once | ORAL | Status: DC

## 2015-12-06 NOTE — Telephone Encounter (Signed)
Pt was on abx now having yeast infection symptoms. Will start diflucan once.

## 2016-06-13 ENCOUNTER — Ambulatory Visit (INDEPENDENT_AMBULATORY_CARE_PROVIDER_SITE_OTHER): Payer: 59 | Admitting: Family Medicine

## 2016-06-13 ENCOUNTER — Encounter: Payer: Self-pay | Admitting: Family Medicine

## 2016-06-13 VITALS — BP 120/72 | HR 81 | Temp 98.4°F | Resp 16 | Ht 60.0 in | Wt 178.0 lb

## 2016-06-13 DIAGNOSIS — E669 Obesity, unspecified: Secondary | ICD-10-CM | POA: Diagnosis not present

## 2016-06-13 DIAGNOSIS — Z8719 Personal history of other diseases of the digestive system: Secondary | ICD-10-CM | POA: Insufficient documentation

## 2016-06-13 DIAGNOSIS — G5603 Carpal tunnel syndrome, bilateral upper limbs: Secondary | ICD-10-CM | POA: Insufficient documentation

## 2016-06-13 DIAGNOSIS — K219 Gastro-esophageal reflux disease without esophagitis: Secondary | ICD-10-CM | POA: Diagnosis not present

## 2016-06-13 DIAGNOSIS — D649 Anemia, unspecified: Secondary | ICD-10-CM | POA: Diagnosis not present

## 2016-06-13 DIAGNOSIS — R7309 Other abnormal glucose: Secondary | ICD-10-CM | POA: Diagnosis not present

## 2016-06-13 DIAGNOSIS — Z0000 Encounter for general adult medical examination without abnormal findings: Secondary | ICD-10-CM

## 2016-06-13 LAB — CBC WITH DIFFERENTIAL/PLATELET
Basophils Absolute: 0 cells/uL (ref 0–200)
Basophils Relative: 0 %
EOS ABS: 57 {cells}/uL (ref 15–500)
Eosinophils Relative: 1 %
HEMATOCRIT: 39.8 % (ref 35.0–45.0)
Hemoglobin: 13.2 g/dL (ref 11.7–15.5)
LYMPHS PCT: 46 %
Lymphs Abs: 2622 cells/uL (ref 850–3900)
MCH: 28 pg (ref 27.0–33.0)
MCHC: 33.2 g/dL (ref 32.0–36.0)
MCV: 84.3 fL (ref 80.0–100.0)
MONO ABS: 570 {cells}/uL (ref 200–950)
MPV: 10.5 fL (ref 7.5–12.5)
Monocytes Relative: 10 %
NEUTROS PCT: 43 %
Neutro Abs: 2451 cells/uL (ref 1500–7800)
Platelets: 323 10*3/uL (ref 140–400)
RBC: 4.72 MIL/uL (ref 3.80–5.10)
RDW: 13 % (ref 11.0–15.0)
WBC: 5.7 10*3/uL (ref 3.8–10.8)

## 2016-06-13 NOTE — Assessment & Plan Note (Addendum)
Secondary to cholelithiasis, 12/2014, required cholecystectomy, seems to have had residual chronic recurrent pancreatic pain / symptoms following this. - Currently treating for GERD, seems to be poorly responding - Consider further work-up and evaluation, maybe referral GI if needed, question mild chronic pancreatitis symptoms with LUQ discomfort

## 2016-06-13 NOTE — Patient Instructions (Signed)
Thank you for coming in to clinic today.  1. For abdominal symptoms - Start the PPI medicine over the counter either Omeprazole (Prilosec) 20mg  daily or Nexium 20mg  daily - Take one capsule 30 min before first meal of day, same time every day for at least 2 weeks, max treatment up to 4 weeks then stop. If it resolves but then comes back again, you can repeat the 2-4 week course again as needed. - Avoid spicy, greasy, fried foods, fatty, also things like caffeine, dark chocolate - If it is due to not having gallbladder possibly your body is having harder time digesting fatty foods - Avoid large meals and late night snacks, also do not go more than 4-5 hours without a snack or meal (not eating will worsen reflux symptoms due to stomach acid)  Also consider other simple OTC meds for dyspepsia symptoms, like TUMS, Peptobismol just to try to see how these help or if not  If the problem improves but keeps coming back, we can discuss higher dose or longer course at next visit.  If symptoms are worsening, persistent symptoms despite treatment or develop esophageal or abdominal pain, unable to swallow solids or liquids, nausea, vomiting, fever/chills, or unintentional weight loss / no appetite, please follow-up sooner or seek more immediate medical attention.  In future, we can discuss referral to GI for further work-up if this ends up being more related to your pancreas  2. Labs today chemistry, kidney, liver, A1c screening, and CBC for checking Hemoglobin for history of anemia  3. Right wrist, suspected carpal tunnel - We will order the Nerve Conduction Study, you will be notified with appointment, this will test upper extremity nerves on both sides - Can continue wrist splint at night and with repetitive activity - IF painful, then recommend trial of Anti-inflammatory with OTC Aleve vs Naproxen (Naprosyn) total dose 440 or 500mg  - take with food and plenty of water TWICE daily every day (breakfast  and dinner), for next 2 to 4 weeks, then you may take only as needed - DO NOT TAKE any ibuprofen, motrin while you are taking this medicine  Based on results we will consider referral to Ortho for injection vs discussion surgery  [facility] - Neurology Dept [address] [place], [place] [address] Phone: [phone]  Hemang K. Sherryll BurgerShah, MD Paulita FujitaZachary E. Malvin JohnsPotter, MD  Reminder next pap smear per GYN would be 08/2017  Please schedule a follow-up appointment with Dr. Althea CharonKaramalegos in 4-6 weeks for Right Carpal Tunnel / GERD, then otherwise 1 year for Annual Physical  If you have any other questions or concerns, please feel free to call the clinic or send a message through MyChart. You may also schedule an earlier appointment if necessary.  Saralyn PilarAlexander Karamalegos, DO [facility]

## 2016-06-13 NOTE — Assessment & Plan Note (Addendum)
Prior history anemia during pregnancy, currently asymptomatic. - Last Hgb 11-12, 12/2014 - Check CBC with annual labs for recent baseline

## 2016-06-13 NOTE — Assessment & Plan Note (Signed)
Possible GERD-like symptoms vs residual pancreatic symptoms following history of prior gallstone pancreatitis. Not clear trigger foods. Question if symptoms related to difficulty with digestion of fats following choley. - No improvement from ranitidine  Plan: 1. OTC trial on Omeprazole 20mg  daily for now 2-4 weeks 2. Recommend follow s/p choley diet 3. May try OTC dyspepsia meds 4. Follow-up if not improving 4-6 weeks, consider future GI referral for further work-up and management if less considered GERD or not responding to therapy

## 2016-06-13 NOTE — Assessment & Plan Note (Addendum)
Suspected bilateral R significantly > Left carpal tunnel syndrome, chronic worsening now with minor activities involving elevated arms, even driving, pressure or tight grip. Limiting her function occasionally. - Worse following pregnancy >1 year ago - No typical pain associated with carpal tunnel, not tried regular NSAIDs - Failed wrist splints overnight (can't wear at work)  Plan: 1. Referral to Neuro for NCS bilateral median/ulnar nerves further evaluation given chronicity of problem 2. Consider prednisone burst - discussion, patient declined due to potential side effects and her symptoms are only intermittent. Also less interested in NSAID course. 3. Follow-up discussion NCS result 6 weeks

## 2016-06-13 NOTE — Assessment & Plan Note (Addendum)
Stable with some weight loss, about 7 lbs in 1 year - Encouraged lifestyle changes diet and exercise - Check labs, chemistry, lipids

## 2016-06-13 NOTE — Progress Notes (Signed)
Subjective:    Patient ID: Kayla Parrish, female    DOB: [birth year], 26 y.o.   MRN: [id number]  Kayla Parrish is a 26 y.o. female presenting on 06/13/2016 for Annual Exam (pt goes to [facility])   HPI   Annual Physical - Fasting today, requesting labs - Of note prior history of abnormal glucose, without prior A1c screening. No family history of DM  Right Wrist Episodic Numbness / Possible Carpal Tunnel Syndrome - Reports chronic problem over past few years with possible carpal tunnel Right hand and some minor symptoms Left, now admits significant worsening over past 6-8 months, describes intermittent episodes of discomfort without significant pain whole hand numbness in all 5 fingers and hand lasting for minutes will improve when lowers hand and moves it some, now occurring more frequently, occurs when raises hand above heart level, lays on arm, holds pressure, writing for more than few seconds to minutes, and overnight will wake up with arm and hand numb. - Patient is Right handed. Works as Engineer, civil (consulting) in ED - Tried wrist splint overnight to keep wrist from flexing, possible relief but unsure. Unable to wear at work. - Not tried any NSAIDs regularly. Only takes occasional Ibuprofen not for this reason - Family history of carpal tunnel syndrome - History of pregnancy x 1 has been worse following pregnancy but was present before pregnancy - Denies weakness, swelling, injury, redness, pain  History of Anemia, normocytic - Reports this was diagnosed only during prior pregnancy, has had some variable Hgb 11-12, without significant diagnosis or problem since then, tried some iron supplement at that time but not taking regularly.  GERD / H/o s/p Cholecystectomy, had pancreatitis due to gallstone - Symptoms all started after pregnancy with obstructing gallstones - Describes chronic recurrent LUQ pains usually after eating, now only occasionally once every 2 weeks after some meals, can be  related to what she eats but not always GERD trigger foods - Tried Zantac 150mg  BID without relief. Asking about PPI such as omeprazole - Feels occasionally shaky if not eating regularly - Has not seen GI  Health Maintenance: - Last pap smear 08/2014 normal unsure if HPV co-testing, prior history of age 70 with abnormal pap smear thought some pre-cancerous cells had LEEP procedure and biopsy showed cells were cancer free, and no prior abnormal paps (for q 6 mo to 1 year). No family history of cervical CA - No family history of Breast CA  Past Medical History:  Diagnosis Date  . Acute gallstone pancreatitis   . Cholelithiases 01/16/2015  . Pancreatitis, acute 01/16/2015   Social History   Social History  . Marital status: Married    Spouse name: N/A  . Number of children: 1  . Years of education: N/A   Occupational History  . 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 Smoking status: Never Smoker  . Smokeless tobacco: Never Used  . Alcohol use Yes  . Drug use: No  . Sexual activity: Yes    Birth control/ protection: IUD   Other Topics Concern  . Not on file   Social History Narrative  . No narrative on file   Family History  Problem Relation Age of Onset  . Thyroid disease Mother   . Hypothyroidism Mother   . Hypertension Paternal Grandmother   . Heart disease Paternal Grandmother   . Diabetes Paternal Grandmother   . Kidney disease Paternal Grandmother   .  Gallbladder disease Neg Hx   . Breast cancer Neg Hx    Current Outpatient Prescriptions on File Prior to Visit  Medication Sig  . levonorgestrel (MIRENA) 20 MCG/24HR IUD 1 each by Intrauterine route once.   No current facility-administered medications on file prior to visit.     Review of Systems  Constitutional: Negative for activity change, appetite change, chills, diaphoresis, fatigue, fever and unexpected weight change.  HENT: Negative for congestion, hearing loss and sinus  pressure.   Eyes: Negative for visual disturbance.  Respiratory: Negative for cough, chest tightness, shortness of breath and wheezing.   Cardiovascular: Negative for chest pain, palpitations and leg swelling.  Gastrointestinal: Negative for abdominal pain, constipation, diarrhea, nausea and vomiting.  Endocrine: Negative for cold intolerance and polyuria.  Genitourinary: Negative for dysuria, frequency, hematuria and menstrual problem.  Musculoskeletal: Negative for arthralgias and neck pain.       Right hand numbness and tingling with repetitive activity or position change  Skin: Negative for rash.  Allergic/Immunologic: Negative for environmental allergies.  Neurological: Negative for dizziness, weakness, light-headedness, numbness and headaches.  Hematological: Negative for adenopathy.  Psychiatric/Behavioral: Negative for behavioral problems, dysphoric mood and sleep disturbance.   Per HPI unless specifically indicated above     Objective:    BP 120/72   Pulse 81   Temp 98.4 F (36.9 C) (Oral)   Resp 16   Ht 5' (1.524 m)   Wt 178 lb (80.7 kg)   BMI 34.76 kg/m   Wt Readings from Last 3 Encounters:  06/13/16 178 lb (80.7 kg)  11/25/15 181 lb (82.1 kg)  05/12/15 185 lb 12.8 oz (84.3 kg)    Physical Exam  Constitutional: She is oriented to person, place, and time. She appears well-developed and well-nourished. No distress.  Well-appearing, comfortable, cooperative, overweight  HENT:  Head: Normocephalic and atraumatic.  Mouth/Throat: Oropharynx is clear and moist.  Eyes: Conjunctivae and EOM are normal. Pupils are equal, round, and reactive to light.  Conjunctiva does not appear pale  Neck: Normal range of motion. Neck supple. No thyromegaly present.  Cardiovascular: Normal rate, regular rhythm, normal heart sounds and intact distal pulses.   No murmur heard. Pulmonary/Chest: Effort normal and breath sounds normal. No respiratory distress. She has no wheezes. She has no  rales.  Abdominal: Soft. Bowel sounds are normal. She exhibits no distension and no mass. There is no tenderness.  Musculoskeletal: Normal range of motion. She exhibits no edema or tenderness.  Right Hand/Wrist Inspection: Normal appearance, symmetrical, no bulky MCP joints, no edema or erythema. Palpation: Non tender hand / wrist, carpal bones, including MCP, base of thumb. No distinct anatomical snuff box or scaphoid tenderness. No tenderness over APL / EPB tendons radially. ROM: full active wrist ROM flex / ext, ulnar / radial deviation, no pain with radial deviation Special Testing: Negative Finkelstein's test. Negative Tinel's median nerve. Mild positive Tinel's over bilateral elbow ulnar nerve. Mild positive with tingling Right hand only without numbness Phalen's and Reverse Phalens. Strength: 5/5 grip, thumb opposition, wrist flex/ext Neurovascular: distally intact  Lymphadenopathy:    She has no cervical adenopathy.  Neurological: She is alert and oriented to person, place, and time.  Skin: Skin is warm and dry. No rash noted. She is not diaphoretic.  Psychiatric: She has a normal mood and affect. Her behavior is normal.  Nursing note and vitals reviewed.    I have personally reviewed the following lab results from 05/12/15.  Results for orders placed or performed  in visit on 05/12/15  Comprehensive Metabolic Panel (CMET)  Result Value Ref Range   Glucose 78 65 - 99 mg/dL   BUN 6 6 - 20 mg/dL   Creatinine, Ser 1.61 0.57 - 1.00 mg/dL   GFR calc non Af Amer 110 >59 mL/min/1.73   GFR calc Af Amer 127 >59 mL/min/1.73   BUN/Creatinine Ratio 8 8 - 20   Sodium 139 134 - 144 mmol/L   Potassium 4.0 3.5 - 5.2 mmol/L   Chloride 103 96 - 106 mmol/L   CO2 23 18 - 29 mmol/L   Calcium 8.8 8.7 - 10.2 mg/dL   Total Protein 7.8 6.0 - 8.5 g/dL   Albumin 4.3 3.5 - 5.5 g/dL   Globulin, Total 3.5 1.5 - 4.5 g/dL   Albumin/Globulin Ratio 1.2 1.1 - 2.5   Bilirubin Total 0.6 0.0 - 1.2 mg/dL    Alkaline Phosphatase 81 39 - 117 IU/L   AST 19 0 - 40 IU/L   ALT 15 0 - 32 IU/L  Lipid Profile  Result Value Ref Range   Cholesterol, Total 145 100 - 199 mg/dL   Triglycerides 87 0 - 149 mg/dL   HDL 38 (L) >09 mg/dL   VLDL Cholesterol Cal 17 5 - 40 mg/dL   LDL Calculated 90 0 - 99 mg/dL   Chol/HDL Ratio 3.8 0.0 - 4.4 ratio units  TSH  Result Value Ref Range   TSH 1.600 0.450 - 4.500 uIU/mL  HM PAP SMEAR  Result Value Ref Range   HM Pap smear normal       Assessment & Plan:   Problem List Items Addressed This Visit    Obesity (BMI 30.0-34.9)    Stable with some weight loss, about 7 lbs in 1 year - Encouraged lifestyle changes diet and exercise - Check labs, chemistry, lipids      Relevant Orders   COMPLETE METABOLIC PANEL WITH GFR   History of acute pancreatitis    Secondary to cholelithiasis, 12/2014, required cholecystectomy, seems to have had residual chronic recurrent pancreatic pain / symptoms following this. - Currently treating for GERD, seems to be poorly responding - Consider further work-up and evaluation, maybe referral GI if needed, question mild chronic pancreatitis symptoms with LUQ discomfort      Relevant Orders   COMPLETE METABOLIC PANEL WITH GFR   Gastroesophageal reflux disease without esophagitis    Possible GERD-like symptoms vs residual pancreatic symptoms following history of prior gallstone pancreatitis. Not clear trigger foods. Question if symptoms related to difficulty with digestion of fats following choley. - No improvement from ranitidine  Plan: 1. OTC trial on Omeprazole 20mg  daily for now 2-4 weeks 2. Recommend follow s/p choley diet 3. May try OTC dyspepsia meds 4. Follow-up if not improving 4-6 weeks, consider future GI referral for further work-up and management if less considered GERD or not responding to therapy      Bilateral carpal tunnel syndrome    Suspected bilateral R significantly > Left carpal tunnel syndrome, chronic  worsening now with minor activities involving elevated arms, even driving, pressure or tight grip. Limiting her function occasionally. - Worse following pregnancy >1 year ago - No typical pain associated with carpal tunnel, not tried regular NSAIDs - Failed wrist splints overnight (can't wear at work)  Plan: 1. Referral to Neuro for NCS bilateral median/ulnar nerves further evaluation given chronicity of problem 2. Consider prednisone burst - discussion, patient declined due to potential side effects and her symptoms are only intermittent. Also  less interested in NSAID course. 3. Follow-up discussion NCS result 6 weeks      Relevant Orders   Ambulatory referral to Neurology   Anemia    Prior history anemia during pregnancy, currently asymptomatic. - Last Hgb 11-12, 12/2014 - Check CBC with annual labs for recent baseline      Relevant Orders   CBC with Differential/Platelet    Other Visit Diagnoses    Annual physical exam    -  Primary   Relevant Orders   COMPLETE METABOLIC PANEL WITH GFR   Hemoglobin A1c   Abnormal glucose       Prior elevated glucose >1 year ago >100, not fasting, no prior A1c screening in past, given obesity, will check A1c with annual labs   Relevant Orders   Hemoglobin A1c      No orders of the defined types were placed in this encounter.     Follow up plan: Return in about 6 weeks (around 07/25/2016) for Right carpal tunnel.  Saralyn Pilar, DO [facility] 06/13/2016, 10:17 PM

## 2016-06-14 LAB — COMPLETE METABOLIC PANEL WITH GFR
ALT: 32 U/L — AB (ref 6–29)
AST: 35 U/L — AB (ref 10–30)
Albumin: 4.1 g/dL (ref 3.6–5.1)
Alkaline Phosphatase: 57 U/L (ref 33–115)
BUN: 7 mg/dL (ref 7–25)
CALCIUM: 8.9 mg/dL (ref 8.6–10.2)
CHLORIDE: 106 mmol/L (ref 98–110)
CO2: 27 mmol/L (ref 20–31)
CREATININE: 0.74 mg/dL (ref 0.50–1.10)
GFR, Est African American: >89 mL/min (ref >=60)
GFR, Est Non African American: >89 mL/min (ref >=60)
Glucose, Bld: 89 mg/dL (ref 65–99)
Potassium: 3.8 mmol/L (ref 3.5–5.3)
Sodium: 140 mmol/L (ref 135–146)
Total Bilirubin: 0.5 mg/dL (ref 0.2–1.2)
Total Protein: 7.5 g/dL (ref 6.1–8.1)

## 2016-06-14 LAB — HEMOGLOBIN A1C
Hgb A1c MFr Bld: 5 % (ref <5.7)
Mean Plasma Glucose: 97 mg/dL

## 2016-07-09 DIAGNOSIS — G5601 Carpal tunnel syndrome, right upper limb: Secondary | ICD-10-CM | POA: Diagnosis not present

## 2016-07-11 DIAGNOSIS — G5601 Carpal tunnel syndrome, right upper limb: Secondary | ICD-10-CM | POA: Diagnosis not present

## 2016-08-24 IMAGING — US US ABDOMEN LIMITED
1 series · 14 of 25 positions shown · non-contrast
Comparison: None.

CLINICAL DATA: Acute onset of right upper quadrant abdominal pain.
Initial encounter.

EXAM:
US ABDOMEN LIMITED - RIGHT UPPER QUADRANT

[Series 1: us abdomen limited · 0.18mm/px · 14 of 42 slices shown]
[im 1/42]
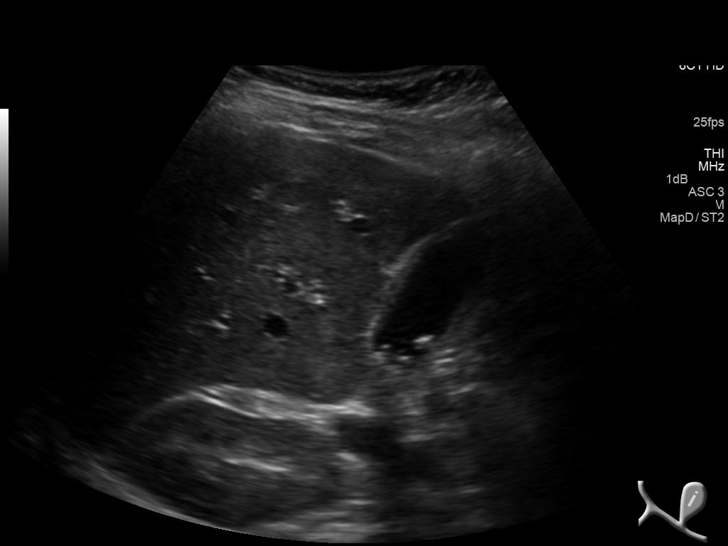
[im 4/42]
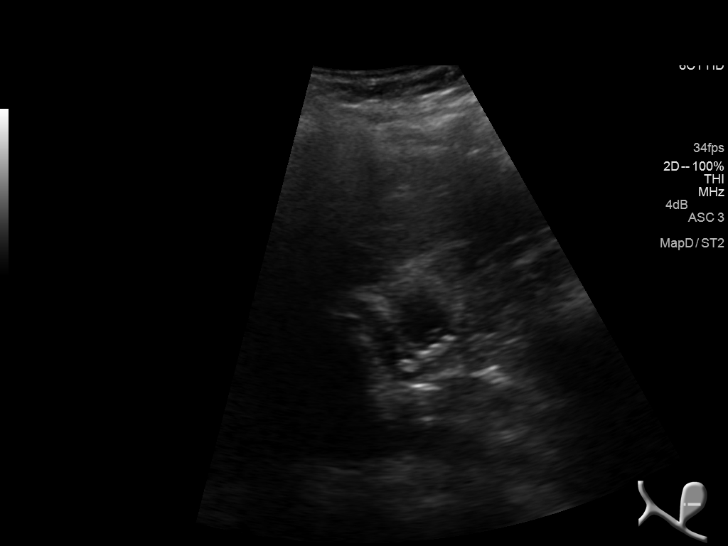
[im 7/42]
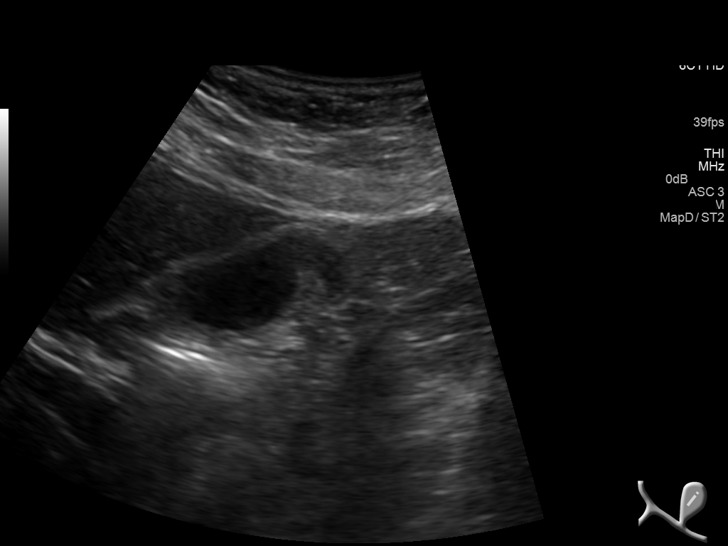
[im 11/42]
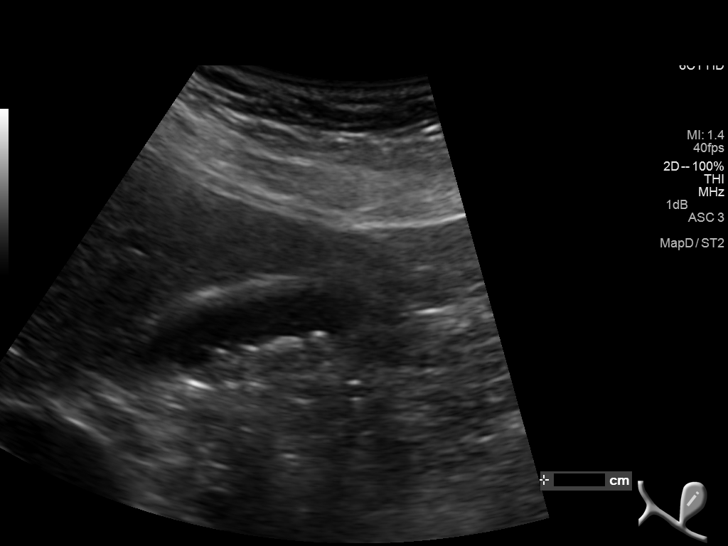
[im 14/42]
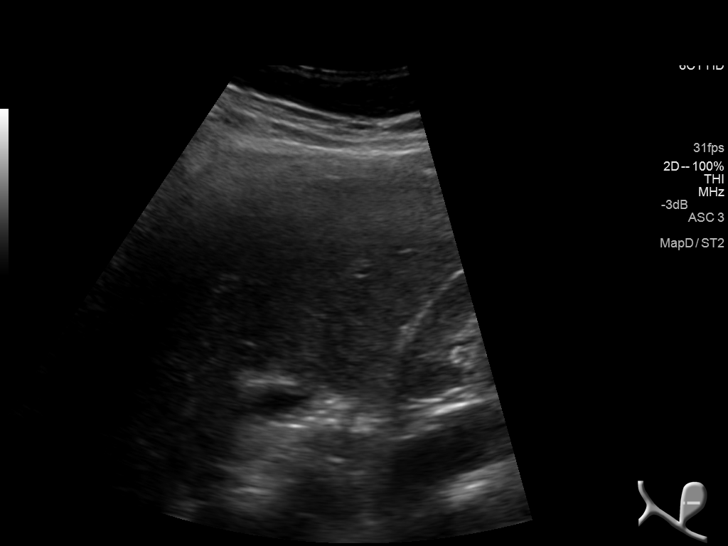
[im 16/42]
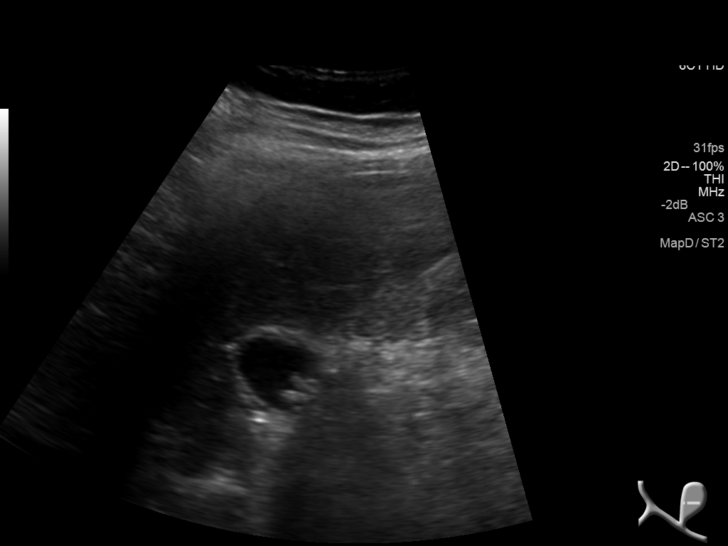
[im 19/42]
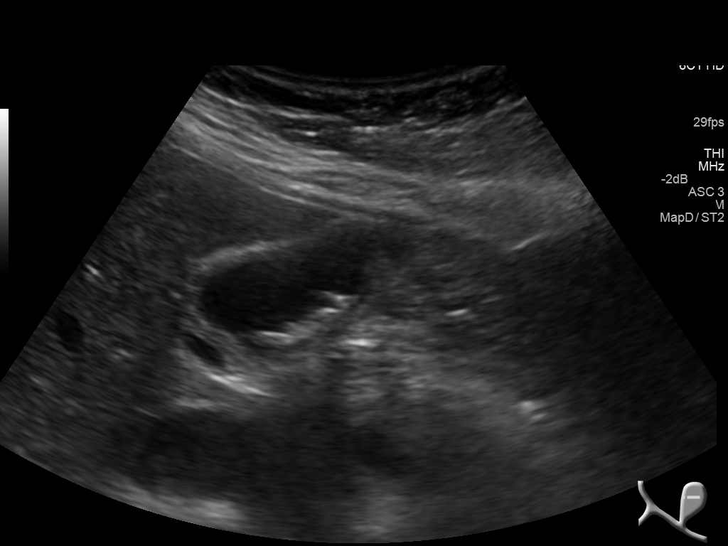
[im 23/42]
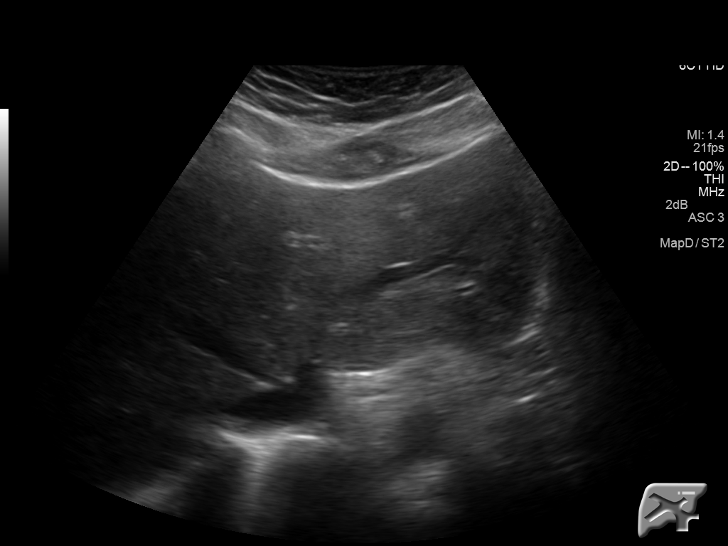
[im 26/42]
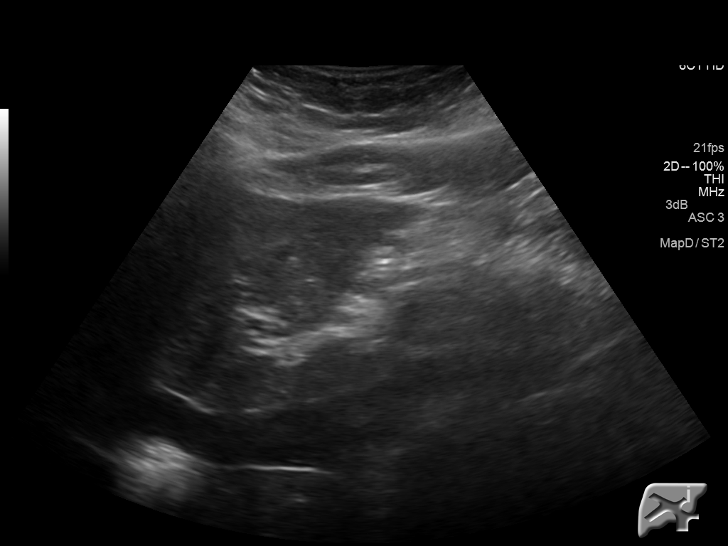
[im 28/42]
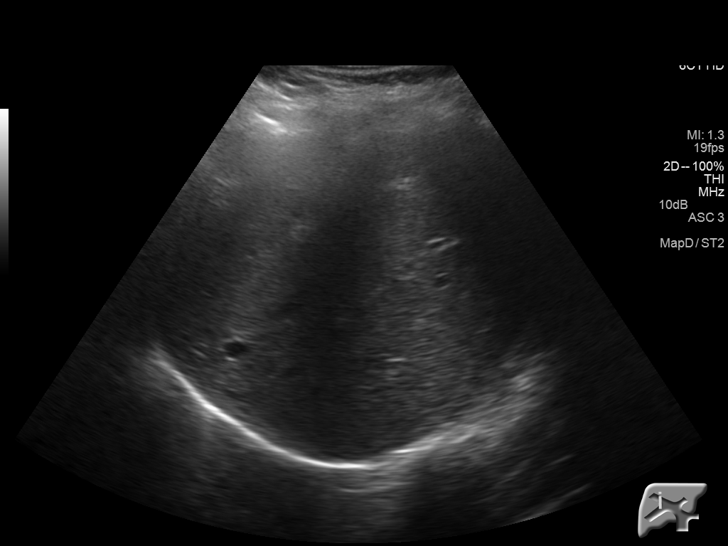
[im 31/42]
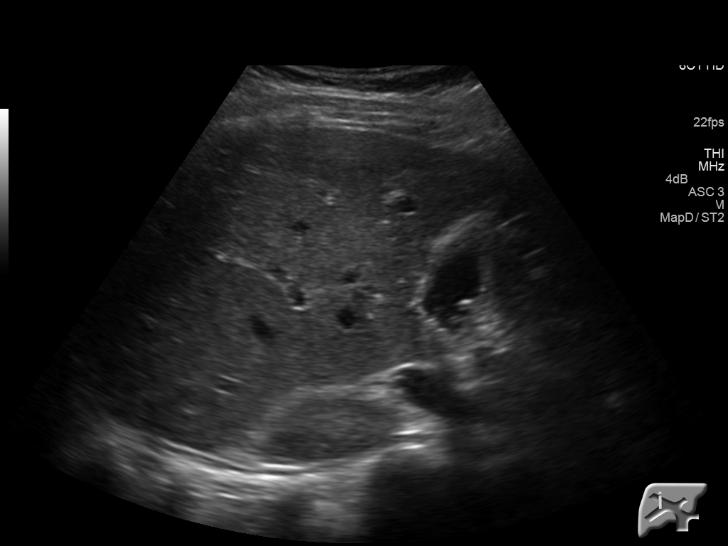
[im 35/42]
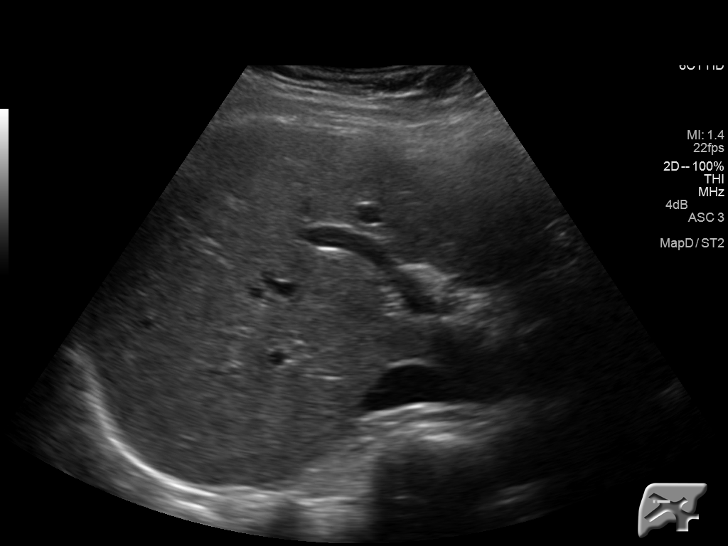
[im 38/42]
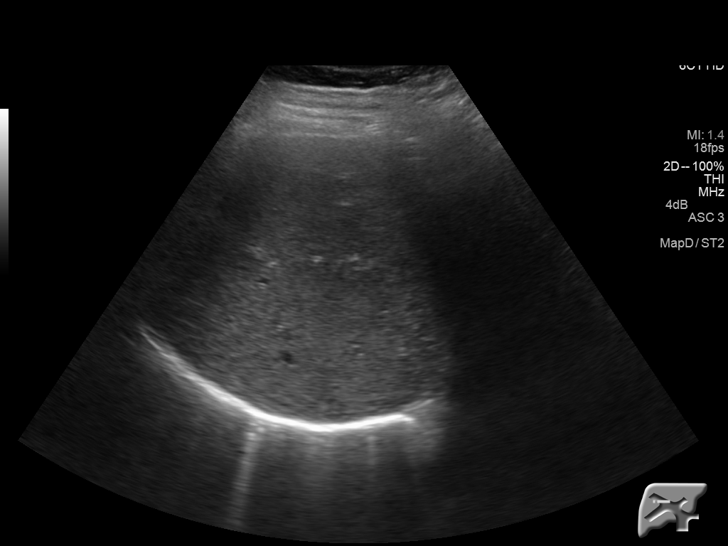
[im 42/42]
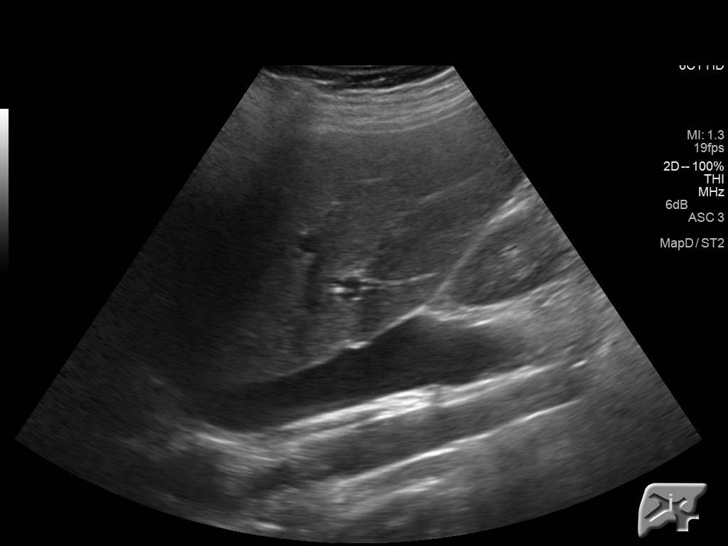

[14 of 25 positions shown; findings below may reference images not displayed]

FINDINGS: Gallbladder:

Multiple stones are seen dependently within the gallbladder,
measuring up to 6 mm in size. There is associated mild gallbladder
wall thickening, measuring up to 3 mm. However, no ultrasonographic
Murphy's sign is elicited. No pericholecystic fluid is seen.

Common bile duct:

Diameter: 0.2 cm, within normal limits in caliber.

Liver:

No focal lesion identified. Within normal limits in parenchymal
echogenicity.
IMPRESSION: Cholelithiasis, with mild gallbladder wall thickening. However, no
ultrasonographic Murphy's sign is elicited. This may reflect mild
chronic inflammation, without definite evidence for acute
cholecystitis or obstruction.

## 2016-12-31 ENCOUNTER — Encounter: Payer: Self-pay | Admitting: Family Medicine

## 2016-12-31 ENCOUNTER — Ambulatory Visit (INDEPENDENT_AMBULATORY_CARE_PROVIDER_SITE_OTHER): Payer: 59 | Admitting: Family Medicine

## 2016-12-31 VITALS — BP 124/72 | HR 113 | Temp 99.9°F | Resp 16 | Ht 60.0 in | Wt 179.0 lb

## 2016-12-31 DIAGNOSIS — T3695XA Adverse effect of unspecified systemic antibiotic, initial encounter: Secondary | ICD-10-CM

## 2016-12-31 DIAGNOSIS — J020 Streptococcal pharyngitis: Secondary | ICD-10-CM

## 2016-12-31 DIAGNOSIS — B379 Candidiasis, unspecified: Secondary | ICD-10-CM | POA: Diagnosis not present

## 2016-12-31 DIAGNOSIS — J029 Acute pharyngitis, unspecified: Secondary | ICD-10-CM

## 2016-12-31 LAB — POCT RAPID STREP A (OFFICE): Rapid Strep A Screen: NEGATIVE

## 2016-12-31 MED ORDER — AMOXICILLIN 500 MG PO CAPS: 500.0000 mg | ORAL_CAPSULE | Freq: Two times a day (BID) | ORAL | 0 refills | Status: DC

## 2016-12-31 MED ORDER — FLUTICASONE PROPIONATE 50 MCG/ACT NA SUSP: 2.0000 | Freq: Every day | NASAL | 3 refills | Status: DC

## 2016-12-31 MED ORDER — FLUCONAZOLE 150 MG PO TABS: ORAL_TABLET | ORAL | 0 refills | Status: DC

## 2016-12-31 NOTE — Patient Instructions (Addendum)
Thank you for coming to the clinic today.  1. You have Strep Throat Pharyngitis based on exam, rapid strep swab test was NEGATIVE - Take antibiotic Amoxicillin 500mg  twice a day for 10 days, finish complete course - Diflucan also prescribed  - Take regular NSAID with either Aleve 1-2 pills twice a day OR Ibuprofen 400 to 600mg  per dose with food every 6-8 hours or 3 times a day for next 3 to 5 days regularly, then as needed only - Take Tylenol 500-1000mg  per dose as needed every 8 hours or 3 times a day between for pain, fevers, or chills - Drink extra clear fluids (water, or G2 gatorade), try colder soft foods if needed otherwise regular diet - Drink warm herbal tea with honey for sore throat  Start nasal steroid Flonase 2 sprays in each nostril daily for 4-6 weeks, may repeat course seasonally or as needed  Recommend oral decongestant pill such as DayQuil to help ear pain and pressure, with fluid behind ears and eustachian tube dysfunction  Please schedule a follow-up appointment with Dr. Althea CharonKaramalegos in 1 week if any worsening or change in symptoms Sore Throat  If you have any other questions or concerns, please feel free to call the clinic or send a message through MyChart. You may also schedule an earlier appointment if necessary.  Additionally, you may be receiving a survey about your experience at our clinic within a few days to 1 week by e-mail or mail. We value your feedback.  Saralyn PilarAlexander Jazminn Pomales, DO [facility]

## 2016-12-31 NOTE — Progress Notes (Signed)
Subjective:    Patient ID: Kayla Parrish, female    DOB: December 24, 1990, 26 y.o.   MRN: [id number]  RONNELL MAKAREWICZ is a 26 y.o. female presenting on 12/31/2016 for Sore Throat (onset 5 days felt chills and fever) and Ear Pain (left is worst ---5 days)  Patient presents for a same day appointment.  HPI   SORE THROAT Reported symptoms started about 5 days ago with sore throat, then gradual worsening to more URI symptoms congestion, and sinus pain and pressure, and worsening to L ear pain, has bilateral discomfort with ears but L is significantly worse. Some reduced appetite, but can tolerate PO well with food and drink. - Last dose Tylenol about 9:15-9:30 am. 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 Smoking status: Never Smoker  . Smokeless tobacco: Never Used  . Alcohol use Yes    Review of Systems Per HPI unless specifically indicated above     Objective:    BP 124/72   Pulse (!) 113   Temp 99.9 F (37.7 C) (Oral)   Resp 16   Ht 5' (1.524 m)   Wt 179 lb (81.2 kg)   SpO2 100%   BMI 34.96 kg/m   Wt Readings from Last 3 Encounters:  12/31/16 179 lb (81.2 kg)  06/13/16 178 lb (80.7 kg)  11/25/15 181 lb (82.1 kg)    Physical Exam  Constitutional: She is oriented to person, place, and time. She appears well-developed and well-nourished. No distress.  Mildly tired-appearing, mostly comfortable except some L ear and throat discomfort, cooperative  HENT:  Head: Normocephalic and atraumatic.  Mouth/Throat: Oropharynx is clear and moist.  Frontal / maxillary sinuses non-tender. Nares mostly patent with some turbinate edema congestion without purulence. Bilateral TMs Left > Right with some slightly  opaque effusion with pressure and fullness without erythema, bulging.  Oropharynx with notable bilateral tonsillar edema, erythema, and exudates, symmetrical.  Eyes: Conjunctivae are normal. Right eye exhibits no discharge. Left eye exhibits no discharge.  Neck: Normal range of motion. Neck supple. No thyromegaly present.  Cardiovascular: Regular rhythm, normal heart sounds and intact distal pulses.   No murmur heard. Tachycardic  Pulmonary/Chest: Effort normal and breath sounds normal. No respiratory distress. She has no wheezes. She has no rales.  Musculoskeletal: Normal range of motion. She exhibits no edema.  Lymphadenopathy:    She has cervical adenopathy (bilateral anterior cervical tender LAD).  Neurological: She is alert and oriented to person, place, and time.  Skin: Skin is warm and dry. No rash noted. She is not diaphoretic. No erythema.  Psychiatric: Her behavior is normal.  Nursing note and vitals reviewed.    Results for orders placed or performed in visit on 12/31/16  POCT rapid strep A  Result Value Ref Range   Rapid Strep A Screen Negative Negative      Assessment & Plan:   Problem List Items Addressed This Visit    None    Visit Diagnoses    Strep sore throat    -  Primary  Consistent with acute pharyngitis, concern for possible strep given history acute pharyngitis with worsening pharyngitis symptoms but some mild viral prodrome, lack of cough, tender anterior cervical  LAD, appearance of posterior pharynx. No known strep or sick contacts. No other localized infection, TMs clear. Able to tolerate PO. Centor Score 4 - decision to treat empirically and offered and declined strep culture. - Additionally some sinus pressure, congestion, possible eustachian tube dysfunction, allergic rhinitis, no evidence AOM at this time.  Plan: 1. Rapid strep negative today - still have high suspicion for strep 2. Start empiric antibiotic with Amoxicillin 500mg  BID x 10 days -  also rx Diflucan given PRN yeast after abx 3. Symptomatic control with NSAID / Tylenol regularly then PRN - Start nasal steroid Flonase 2 sprays in each nostril daily for 4-6 weeks, may repeat course seasonally or as needed 4. Improve hydration, advance diet, warm herbal tea with honey 5. Follow-up within 1 week if worsening    Relevant Medications   amoxicillin (AMOXIL) 500 MG capsule   fluconazole (DIFLUCAN) 150 MG tablet   Sore throat       Relevant Orders   POCT rapid strep A (Completed)   Antibiotic-induced yeast infection        Relevant Medications   fluconazole (DIFLUCAN) 150 MG tablet      Meds ordered this encounter  Medications  . amoxicillin (AMOXIL) 500 MG capsule    Sig: Take 1 capsule (500 mg total) by mouth 2 (two) times daily. For 10 days    Dispense:  20 capsule    Refill:  0  . fluticasone (FLONASE) 50 MCG/ACT nasal spray    Sig: Place 2 sprays into both nostrils daily. Use for 4-6 weeks then stop and use seasonally or as needed.    Dispense:  16 g    Refill:  3  . fluconazole (DIFLUCAN) 150 MG tablet    Sig: Take one tablet by mouth on Day 1. Repeat dose 2nd tablet on Day 3.    Dispense:  2 tablet    Refill:  0    Follow up plan: Return in about 1 week (around 01/07/2017), or if symptoms worsen or fail to improve, for Strep throat.  Saralyn PilarAlexander Jood Retana, DO [facility] 12/31/2016, 1:00 PM

## 2017-03-11 ENCOUNTER — Ambulatory Visit (INDEPENDENT_AMBULATORY_CARE_PROVIDER_SITE_OTHER): Payer: BC Managed Care – PPO | Admitting: Obstetrics and Gynecology

## 2017-03-11 ENCOUNTER — Encounter: Payer: Self-pay | Admitting: Obstetrics and Gynecology

## 2017-03-11 VITALS — BP 118/74 | Ht 60.0 in | Wt 186.0 lb

## 2017-03-11 DIAGNOSIS — Z30432 Encounter for removal of intrauterine contraceptive device: Secondary | ICD-10-CM

## 2017-03-11 NOTE — Progress Notes (Signed)
   IUD Removal  Patient identified, informed consent performed, consent signed.  Patient was in the dorsal lithotomy position, normal external genitalia was noted.  A speculum was placed in the patient's vagina, normal discharge was noted, no lesions. The cervix was visualized, no lesions, no abnormal discharge.  The strings of the IUD were grasped and pulled using ring forceps. The IUD was removed in its entirety. Patient tolerated the procedure well.    Patient plans for pregnancy soon and she was told to avoid teratogens, take PNV and folic acid.  Routine preventative health maintenance measures emphasized.  Thomasene MohairStephen Tymar Polyak, MD 03/11/2017 5:19 PM

## 2017-05-17 ENCOUNTER — Ambulatory Visit (INDEPENDENT_AMBULATORY_CARE_PROVIDER_SITE_OTHER): Payer: BC Managed Care – PPO | Admitting: Maternal Newborn

## 2017-05-17 ENCOUNTER — Encounter: Payer: Self-pay | Admitting: Maternal Newborn

## 2017-05-17 ENCOUNTER — Other Ambulatory Visit: Payer: Self-pay

## 2017-05-17 VITALS — BP 110/74 | HR 76 | Wt 190.0 lb

## 2017-05-17 DIAGNOSIS — N912 Amenorrhea, unspecified: Secondary | ICD-10-CM

## 2017-05-17 DIAGNOSIS — Z6836 Body mass index (BMI) 36.0-36.9, adult: Secondary | ICD-10-CM | POA: Insufficient documentation

## 2017-05-17 DIAGNOSIS — Z3481 Encounter for supervision of other normal pregnancy, first trimester: Secondary | ICD-10-CM

## 2017-05-17 DIAGNOSIS — O3441 Maternal care for other abnormalities of cervix, first trimester: Secondary | ICD-10-CM | POA: Insufficient documentation

## 2017-05-17 DIAGNOSIS — Z0189 Encounter for other specified special examinations: Secondary | ICD-10-CM

## 2017-05-17 LAB — POCT URINE PREGNANCY: Preg Test, Ur: POSITIVE — AB

## 2017-05-17 NOTE — Patient Instructions (Signed)
First Trimester of Pregnancy The first trimester of pregnancy is from week 1 until the end of week 13 (months 1 through 3). A week after a sperm fertilizes an egg, the egg will implant on the wall of the uterus. This embryo will begin to develop into a baby. Genes from you and your partner will form the baby. The female genes will determine whether the baby will be a boy or a girl. At 6-8 weeks, the eyes and face will be formed, and the heartbeat can be seen on ultrasound. At the end of 12 weeks, all the baby's organs will be formed. Now that you are pregnant, you will want to do everything you can to have a healthy baby. Two of the most important things are to get good prenatal care and to follow your health care provider's instructions. Prenatal care is all the medical care you receive before the baby's birth. This care will help prevent, find, and treat any problems during the pregnancy and childbirth. Body changes during your first trimester Your body goes through many changes during pregnancy. The changes vary from woman to woman.  You may gain or lose a couple of pounds at first.  You may feel sick to your stomach (nauseous) and you may throw up (vomit). If the vomiting is uncontrollable, call your health care provider.  You may tire easily.  You may develop headaches that can be relieved by medicines. All medicines should be approved by your health care provider.  You may urinate more often. Painful urination may mean you have a bladder infection.  You may develop heartburn as a result of your pregnancy.  You may develop constipation because certain hormones are causing the muscles that push stool through your intestines to slow down.  You may develop hemorrhoids or swollen veins (varicose veins).  Your breasts may begin to grow larger and become tender. Your nipples may stick out more, and the tissue that surrounds them (areola) may become darker.  Your gums may bleed and may be  sensitive to brushing and flossing.  Dark spots or blotches (chloasma, mask of pregnancy) may develop on your face. This will likely fade after the baby is born.  Your menstrual periods will stop.  You may have a loss of appetite.  You may develop cravings for certain kinds of food.  You may have changes in your emotions from day to day, such as being excited to be pregnant or being concerned that something may go wrong with the pregnancy and baby.  You may have more vivid and strange dreams.  You may have changes in your hair. These can include thickening of your hair, rapid growth, and changes in texture. Some women also have hair loss during or after pregnancy, or hair that feels dry or thin. Your hair will most likely return to normal after your baby is born.  What to expect at prenatal visits During a routine prenatal visit:  You will be weighed to make sure you and the baby are growing normally.  Your blood pressure will be taken.  Your abdomen will be measured to track your baby's growth.  The fetal heartbeat will be listened to between weeks 10 and 14 of your pregnancy.  Test results from any previous visits will be discussed.  Your health care provider may ask you:  How you are feeling.  If you are feeling the baby move.  If you have had any abnormal symptoms, such as leaking fluid, bleeding, severe headaches,   or abdominal cramping.  If you are using any tobacco products, including cigarettes, chewing tobacco, and electronic cigarettes.  If you have any questions.  Other tests that may be performed during your first trimester include:  Blood tests to find your blood type and to check for the presence of any previous infections. The tests will also be used to check for low iron levels (anemia) and protein on red blood cells (Rh antibodies). Depending on your risk factors, or if you previously had diabetes during pregnancy, you may have tests to check for high blood  sugar that affects pregnant women (gestational diabetes).  Urine tests to check for infections, diabetes, or protein in the urine.  An ultrasound to confirm the proper growth and development of the baby.  Fetal screens for spinal cord problems (spina bifida) and Down syndrome.  HIV (human immunodeficiency virus) testing. Routine prenatal testing includes screening for HIV, unless you choose not to have this test.  You may need other tests to make sure you and the baby are doing well.  Follow these instructions at home: Medicines  Follow your health care provider's instructions regarding medicine use. Specific medicines may be either safe or unsafe to take during pregnancy.  Take a prenatal vitamin that contains at least 600 micrograms (mcg) of folic acid.  If you develop constipation, try taking a stool softener if your health care provider approves. Eating and drinking  Eat a balanced diet that includes fresh fruits and vegetables, whole grains, good sources of protein such as meat, eggs, or tofu, and low-fat dairy. Your health care provider will help you determine the amount of weight gain that is right for you.  Avoid raw meat and uncooked cheese. These carry germs that can cause birth defects in the baby.  Eating four or five small meals rather than three large meals a day may help relieve nausea and vomiting. If you start to feel nauseous, eating a few soda crackers can be helpful. Drinking liquids between meals, instead of during meals, also seems to help ease nausea and vomiting.  Limit foods that are high in fat and processed sugars, such as fried and sweet foods.  To prevent constipation: ? Eat foods that are high in fiber, such as fresh fruits and vegetables, whole grains, and beans. ? Drink enough fluid to keep your urine clear or pale yellow. Activity  Exercise only as directed by your health care provider. Most women can continue their usual exercise routine during  pregnancy. Try to exercise for 30 minutes at least 5 days a week. Exercising will help you: ? Control your weight. ? Stay in shape. ? Be prepared for labor and delivery.  Experiencing pain or cramping in the lower abdomen or lower back is a good sign that you should stop exercising. Check with your health care provider before continuing with normal exercises.  Try to avoid standing for long periods of time. Move your legs often if you must stand in one place for a long time.  Avoid heavy lifting.  Wear low-heeled shoes and practice good posture.  You may continue to have sex unless your health care provider tells you not to. Relieving pain and discomfort  Wear a good support bra to relieve breast tenderness.  Take warm sitz baths to soothe any pain or discomfort caused by hemorrhoids. Use hemorrhoid cream if your health care provider approves.  Rest with your legs elevated if you have leg cramps or low back pain.  If you develop   varicose veins in your legs, wear support hose. Elevate your feet for 15 minutes, 3-4 times a day. Limit salt in your diet. Prenatal care  Schedule your prenatal visits by the twelfth week of pregnancy. They are usually scheduled monthly at first, then more often in the last 2 months before delivery.  Write down your questions. Take them to your prenatal visits.  Keep all your prenatal visits as told by your health care provider. This is important. Safety  Wear your seat belt at all times when driving.  Make a list of emergency phone numbers, including numbers for family, friends, the hospital, and police and fire departments. General instructions  Ask your health care provider for a referral to a local prenatal education class. Begin classes no later than the beginning of month 6 of your pregnancy.  Ask for help if you have counseling or nutritional needs during pregnancy. Your health care provider can offer advice or refer you to specialists for help  with various needs.  Do not use hot tubs, steam rooms, or saunas.  Do not douche or use tampons or scented sanitary pads.  Do not cross your legs for long periods of time.  Avoid cat litter boxes and soil used by cats. These carry germs that can cause birth defects in the baby and possibly loss of the fetus by miscarriage or stillbirth.  Avoid all smoking, herbs, alcohol, and medicines not prescribed by your health care provider. Chemicals in these products affect the formation and growth of the baby.  Do not use any products that contain nicotine or tobacco, such as cigarettes and e-cigarettes. If you need help quitting, ask your health care provider. You may receive counseling support and other resources to help you quit.  Schedule a dentist appointment. At home, brush your teeth with a soft toothbrush and be gentle when you floss. Contact a health care provider if:  You have dizziness.  You have mild pelvic cramps, pelvic pressure, or nagging pain in the abdominal area.  You have persistent nausea, vomiting, or diarrhea.  You have a bad smelling vaginal discharge.  You have pain when you urinate.  You notice increased swelling in your face, hands, legs, or ankles.  You are exposed to fifth disease or chickenpox.  You are exposed to German measles (rubella) and have never had it. Get help right away if:  You have a fever.  You are leaking fluid from your vagina.  You have spotting or bleeding from your vagina.  You have severe abdominal cramping or pain.  You have rapid weight gain or loss.  You vomit blood or material that looks like coffee grounds.  You develop a severe headache.  You have shortness of breath.  You have any kind of trauma, such as from a fall or a car accident. Summary  The first trimester of pregnancy is from week 1 until the end of week 13 (months 1 through 3).  Your body goes through many changes during pregnancy. The changes vary from  woman to woman.  You will have routine prenatal visits. During those visits, your health care provider will examine you, discuss any test results you may have, and talk with you about how you are feeling. This information is not intended to replace advice given to you by your health care provider. Make sure you discuss any questions you have with your health care provider. Document Released: 05/01/2001 Document Revised: 04/18/2016 Document Reviewed: 04/18/2016 Elsevier Interactive Patient Education  2018 Elsevier   Inc.  

## 2017-05-17 NOTE — Addendum Note (Signed)
Addended by: Kathlene CoteSTANLEY, Ninamarie Keel G on: 05/17/2017 11:00 AM   Modules accepted: Orders

## 2017-05-17 NOTE — Progress Notes (Signed)
05/17/2017   Chief Complaint: Amenorrhea, positive home pregnancy test, desires prenatal care.  Transfer of Care Patient: no  History of Present Illness: [name] is a 26 y.o. G1P1001 Unknown based on Patient's last menstrual period was 04/09/2017 (exact date). with an Estimated Date of Delivery: None noted., with the above CC.   Her periods were: regular periods every 28 days She was using no method when she conceived.  She has Positive signs or symptoms of nausea/vomiting of pregnancy. She has Negative signs or symptoms of miscarriage or preterm labor She identifies Negative Zika risk factors for her and her partner On any different medications around the time she conceived/early pregnancy: No  History of varicella: Yes   ROS: A 12-point review of systems was performed and negative, except as stated in the above HPI.  OBGYN History: As per HPI. OB History  Gravida Para Term Preterm AB Living  2 1 1     1   SAB TAB Ectopic Multiple Live Births          1    # Outcome Date GA Lbr Len/2nd Weight Sex Delivery Anes PTL Lv  2 Current           1 Term 07/12/14   7 lb 2 oz (3.232 kg) F Vag-Spont   LIV      Any issues with any prior pregnancies: no Any prior children are healthy, doing well, without any problems or issues: yes History of pap smears: Yes. Last pap smear 05/12/2015, NIL History of STIs: No   Past Medical History: Past Medical History:  Diagnosis Date  . Acute gallstone pancreatitis   . Cholelithiases 01/16/2015  . Pancreatitis, acute 01/16/2015    Past Surgical History: Past Surgical History:  Procedure Laterality Date  . CHOLECYSTECTOMY N/A 01/19/2015   Procedure: LAPAROSCOPIC CHOLECYSTECTOMY WITH INTRAOPERATIVE CHOLANGIOGRAM;  Surgeon: Ida Roguehristopher Lundquist, MD;  Location: [facility] ORS;  Service: General;  Laterality: N/A;    Family History:  Family History  Problem Relation Age of Onset  . Thyroid disease Mother   . Hypothyroidism Mother   . Hypertension  Paternal Grandmother   . Heart disease Paternal Grandmother   . Diabetes Paternal Grandmother   . Kidney disease Paternal Grandmother   . Gallbladder disease Neg Hx   . Breast cancer Neg Hx    She denies any female cancers, bleeding or blood clotting disorders.  She denies any history of intellectual disability, birth defects or genetic disorders in her or the FOB's history  Social History:  Social History   Socioeconomic History  . Marital status: Married    Spouse name: Not on file  . Number of children: 1  . Years of education: Not on file  . Highest education level: Not on file  Social Needs  . Financial resource strain: Not on file  . Food insecurity - worry: Not on file  . Food insecurity - inability: Not on file  . Transportation needs - medical: Not on file  . Transportation needs - non-medical: Not on file  Occupational History  . Occupation: Teacher, adult educationN    Employer: [employer]    Comment: Works in ED  Tobacco Use  . Smoking status: Never Smoker  . Smokeless tobacco: Never Used  Substance and Sexual Activity  . Alcohol use: Yes  . Drug use: No  . Sexual activity: Yes    Birth control/protection: IUD  Other Topics Concern  . Not on file  Social History Narrative  . Not on  file   Any cats in the household: no   Allergy: Allergies  Allergen Reactions  . Tramadol Anaphylaxis and Rash    Current Outpatient Medications:  Current Outpatient Medications:  .  Prenatal Vit-Fe Fumarate-FA (MULTIVITAMIN-PRENATAL) 27-0.8 MG TABS tablet, Take 1 tablet by mouth daily at 12 noon., Disp: , Rfl:  .  amoxicillin (AMOXIL) 500 MG capsule, Take 1 capsule (500 mg total) by mouth 2 (two) times daily. For 10 days (Patient not taking: Reported on 03/11/2017), Disp: 20 capsule, Rfl: 0 .  fluconazole (DIFLUCAN) 150 MG tablet, Take one tablet by mouth on Day 1. Repeat dose 2nd tablet on Day 3. (Patient not taking: Reported on 03/11/2017), Disp: 2 tablet, Rfl: 0 .  fluticasone  (FLONASE) 50 MCG/ACT nasal spray, Place 2 sprays into both nostrils daily. Use for 4-6 weeks then stop and use seasonally or as needed. (Patient not taking: Reported on 03/11/2017), Disp: 16 g, Rfl: 3 .  levonorgestrel (MIRENA) 20 MCG/24HR IUD, 1 each by Intrauterine route once., Disp: , Rfl:    Physical Exam:   BP 110/74   Pulse 76   Wt 190 lb (86.2 kg)   LMP 04/09/2017 (Exact Date)   BMI 37.11 kg/m  Body mass index is 37.11 kg/m. Constitutional: Well nourished, well developed female in no acute distress.  Neck:  Supple, normal appearance, and no thyromegaly  Cardiovascular: S1, S2 normal, no murmur, rub or gallop, regular rate and rhythm Respiratory:  Clear to auscultation bilateral. Normal respiratory effort Abdomen: positive bowel sounds and no masses, hernias; diffusely non tender to palpation, non distended Breasts: right breast normal without mass, skin or nipple changes or axillary nodes, left breast normal without mass, skin or nipple changes or axillary nodes, risk and benefit of breast self-exam was discussed. Neuro/Psych:  Normal mood and affect.  Skin:  Warm and dry.  Lymphatic:  No inguinal lymphadenopathy.   Pelvic exam: is not limited by body habitus External genitalia, Bartholin's glands, Urethra, Skene's glands: within normal limits Vagina: within normal limits and with no blood in the vault  Cervix: normal appearing cervix without discharge or lesions, closed/long/high Uterus:  normal contour Adnexa:  no mass, fullness, tenderness  Assessment: [name] is a 26 y.o. G1P1001 Unknown based on Patient's last menstrual period was 04/09/2017 (exact date). with an Estimated Date of Delivery: None noted., presenting for prenatal care.  Plan:  1) Avoid alcoholic beverages. 2) Patient encouraged not to smoke.  3) Discontinue the use of all non-medicinal drugs and chemicals.  4) Take prenatal vitamins daily.  5) Seatbelt use advised 6) Nutrition, food safety (fish,  cheese advisories, and high nitrite foods) and exercise discussed. 7) [facility]  8) Patient is asked about travel to areas at risk for the Zika virus, and counseled to avoid travel and exposure to mosquitoes or sexual partners who may have themselves been exposed to the virus. Testing is discussed, and will be ordered as appropriate.  9) [facility] advised 10) Genetic Screening, such as with 1st Trimester Screening, cell free fetal DNA, AFP testing, and Ultrasound, as well as with amniocentesis and CVS as appropriate, is discussed with patient. She plans to decline genetic testing this pregnancy. 11) Return in about 1 week (around 05/24/2017) for ROB following ultrasound/GTT and NOB labs.  Problem list reviewed and updated.  Marcelyn BruinsJacelyn Joeanthony Seeling, CNM [facility], [facility] 05/17/2017  10:05 AM

## 2017-05-17 NOTE — Progress Notes (Signed)
NOB + home test.

## 2017-05-19 LAB — URINE CULTURE

## 2017-05-19 LAB — GC/CHLAMYDIA PROBE AMP
CHLAMYDIA, DNA PROBE: NEGATIVE
NEISSERIA GONORRHOEAE BY PCR: NEGATIVE

## 2017-05-20 ENCOUNTER — Encounter: Payer: Self-pay | Admitting: Maternal Newborn

## 2017-05-20 ENCOUNTER — Other Ambulatory Visit: Payer: Self-pay | Admitting: Obstetrics & Gynecology

## 2017-05-20 MED ORDER — ONDANSETRON 4 MG PO TBDP: 4.0000 mg | ORAL_TABLET | Freq: Four times a day (QID) | ORAL | 3 refills | Status: DC | PRN

## 2017-05-20 NOTE — Telephone Encounter (Signed)
ERx [facility] [place]

## 2017-05-21 NOTE — L&D Delivery Note (Signed)
Delivery Note Baby B At  a viable female was delivered via  breech presentation.  APGAR: 2, 8; weight 6lbs 1oz.   Placenta status: spontanous, intact.  Cord: 3VC without complications.  Cord pH: N/A  Anesthesia:  Epidural Episiotomy: None Lacerations: First degree Suture Repair: none  I was present for the delivery secondary to baby B being in non-vertex presentation.  At initial presentation the patient was deemed an appropriate candidate for vaginal breech extraction of twin B based on tested pelvis with prior vaginal delivery, concordant fetal growth, with overall EFW of both babies well above 2500g.  Following delivery of twin A, ultrasound examination reveal twin B to remain in a transverse back up position.  Following amniotomy there was some difficulty in reaching the fetal feet which were in the maternal left upper quadrant with hips and knees flexed.  The left leg was splinted and brought into the lower uterine segment.  The left leg was followed up to the right hip allowing identification of the right leg which was likewise splinted and brought into the pelvis.  The fetus was delivered to the level of the scapula.  The left hand had delivered with the body.The right arm was splinted and swept across the infants chest to facilitate delivery.  The head was flexed using a Mauriceau-Smellie-Veit maneuver and delivered with maternal effort as well as downward directed pubic pressure.    Vena Austriandreas Shyhiem Beeney 12/27/2017, 5:48 AM

## 2017-05-21 NOTE — L&D Delivery Note (Addendum)
  Kayla Parrish, [place] [[id number]]  Delivery Note At 5:02 AM a viable female was delivered via Vaginal, Spontaneous (Presentation: ROA).  APGAR: 8, 9; weight 5 lb 8.9 oz (2520 g).   Placenta status: delivered spontaneously, intact.  Cord: 3VC with the following complications: None.  Cord pH: n/a  Anesthesia:  epidural Episiotomy: None Lacerations: None Suture Repair: n/a Est. Blood Loss (mL): 650  Mom to postpartum.  Baby to Couplet care / Skin to Skin.  Given patient had diamniotic-dichorionic twins with fetus A in cephalic presentation and fetus B transverse, and patient has a history of successful vaginal delivery, she was deemed an appropriate candidate for vaginal delivery of both twins.  The patient was set up in the OR for delivery with nursing, pediatrics, and anesthesia also present.  Mom pushed to deliver a viable female infant.  The head followed by shoulders, which delivered without difficulty, and the rest of the body.  No nuchal cord noted.  Baby to mom's chest.  Cord clamped and cut after > 1 min delay.  Cord blood obtained.  Baby A taken for evaluation and bedside ultrasound used to determine presentation of twin B.  The membranes were ruptured with a contraction.  See separate delivery note for delivery of twin B, who was delivered from the breech presentation.  No vaginal, cervical, or perineal lacerations noted at the end of the delivery of twin B.  Cytotec 1,000 mcg placed rectally for prevention of hemorrhage.  EBL: 650 mL.  Fundus firm with normal lochia at end of delivery of twin B.  QBL: 1,461 mL.    Thomasene MohairStephen Zinedine Ellner, MD 12/27/2017, 5:52 AM

## 2017-05-28 ENCOUNTER — Other Ambulatory Visit: Payer: BC Managed Care – PPO

## 2017-05-28 ENCOUNTER — Other Ambulatory Visit: Payer: Self-pay | Admitting: Maternal Newborn

## 2017-05-28 ENCOUNTER — Ambulatory Visit (INDEPENDENT_AMBULATORY_CARE_PROVIDER_SITE_OTHER): Payer: BC Managed Care – PPO | Admitting: Obstetrics & Gynecology

## 2017-05-28 ENCOUNTER — Encounter: Payer: Self-pay | Admitting: Obstetrics & Gynecology

## 2017-05-28 ENCOUNTER — Ambulatory Visit (INDEPENDENT_AMBULATORY_CARE_PROVIDER_SITE_OTHER): Payer: BC Managed Care – PPO

## 2017-05-28 VITALS — BP 120/70 | Wt 183.0 lb

## 2017-05-28 DIAGNOSIS — Z362 Encounter for other antenatal screening follow-up: Secondary | ICD-10-CM

## 2017-05-28 DIAGNOSIS — Z3A01 Less than 8 weeks gestation of pregnancy: Secondary | ICD-10-CM

## 2017-05-28 DIAGNOSIS — O0991 Supervision of high risk pregnancy, unspecified, first trimester: Secondary | ICD-10-CM

## 2017-05-28 DIAGNOSIS — O0993 Supervision of high risk pregnancy, unspecified, third trimester: Secondary | ICD-10-CM | POA: Insufficient documentation

## 2017-05-28 DIAGNOSIS — IMO0002 Reserved for concepts with insufficient information to code with codable children: Secondary | ICD-10-CM | POA: Insufficient documentation

## 2017-05-28 DIAGNOSIS — Z0189 Encounter for other specified special examinations: Secondary | ICD-10-CM

## 2017-05-28 NOTE — Patient Instructions (Signed)
Multiple Pregnancy Having a multiple pregnancy means that a woman is carrying more than one baby at a time. She may be pregnant with twins, triplets, or more. The majority of multiple pregnancies are twins. Naturally conceiving triplets or more (higher-order multiples) is rare. Multiple pregnancies are riskier than single pregnancies. A woman with a multiple pregnancy is more likely to have certain problems during her pregnancy. Therefore, she will need to have more frequent appointments for prenatal care. How does a multiple pregnancy happen? A multiple pregnancy happens when:  The woman's body releases more than one egg at a time, and then each egg gets fertilized by a different sperm. ? This is the most common type of multiple pregnancy. ? Twins or other multiples produced this way are fraternal. They are no more alike than non-multiple siblings are.  One sperm fertilizes one egg, which then divides into more than one embryo. ? Twins or other multiples produced this way are identical. Identical multiples are always the same gender, and they look very much alike.  Who is most likely to have a multiple pregnancy? A multiple pregnancy is more likely to develop in women who:  Have had fertility treatment, especially if the treatment included fertility drugs.  Are older than 29 years of age.  Have already had four or more children.  Have a family history of multiple pregnancy.  How is a multiple pregnancy diagnosed? A multiple pregnancy may be diagnosed based on:  Symptoms such as: ? Rapid weight gain in the first 3 months of pregnancy (first trimester). ? More severe nausea and breast tenderness than what is typical of a single pregnancy. ? The uterus measuring larger than what is normal for the stage of the pregnancy.  Blood tests that detect a higher-than-normal level of human chorionic gonadotropin (hCG). This is a hormone that your body produces in early pregnancy.  Ultrasound  exam. This is used to confirm that you are carrying multiples.  What risks are associated with multiple pregnancy? A multiple pregnancy puts you at a higher risk for certain problems during or after your pregnancy, including:  Having your babies delivered before you have reached a full-term pregnancy (preterm birth). A full-term pregnancy lasts for at least 37 weeks. Babies born before 53 weeks may have a higher risk of a variety of health problems, such as breathing problems, feeding difficulties, cerebral palsy, and learning disabilities.  Diabetes.  Preeclampsia. This is a serious condition that causes high blood pressure along with other symptoms, such as swelling and headaches, during pregnancy.  Excessive blood loss after childbirth (postpartum hemorrhage).  Postpartum depression.  Low birth weight of the babies.  How will having a multiple pregnancy affect my care? Your health care provider will want to monitor you more closely during your pregnancy to make sure that your babies are growing normally and that you are healthy. Follow these instructions at home: Because your pregnancy is considered to be high risk, you will need to work closely with your health care team. You may also need to make some lifestyle changes. These may include the following: Eating and drinking  Increase your nutrition. ? Follow your health care provider's recommendations for weight gain. You may need to gain a little extra weight when you are pregnant with multiples. ? Eat healthy snacks often throughout the day. This can add calories and reduce nausea.  Drink enough fluid to keep your urine clear or pale yellow.  Take prenatal vitamins. Activity By 20-24 weeks, you may  need to limit your activities.  Avoid activities and work that take a lot of effort (are strenuous).  Ask your health care provider when you should stop having sexual intercourse.  Rest often.  General instructions  Do not use  any products that contain nicotine or tobacco, such as cigarettes and e-cigarettes. If you need help quitting, ask your health care provider.  Do not drink alcohol or use illegal drugs.  Take over-the-counter and prescription medicines only as told by your health care provider.  Arrange for extra help around the house.  Keep all follow-up visits and all prenatal visits as told by your health care provider. This is important. Contact a health care provider if:  You have dizziness.  You have persistent nausea, vomiting, or diarrhea.  You are having trouble gaining weight.  You have feelings of depression or other emotions that are interfering with your normal activities. Get help right away if:  You have a fever.  You have pain with urination.  You have fluid leaking from your vagina.  You have a bad-smelling vaginal discharge.  You notice increased swelling in your face, hands, legs, or ankles.  You have spotting or bleeding from your vagina.  You have pelvic cramps, pelvic pressure, or nagging pain in your abdomen or lower back.  You are having regular contractions.  You develop a severe headache, with or without visual changes.  You have shortness of breath or chest pain.  You notice less fetal movement, or no fetal movement. This information is not intended to replace advice given to you by your health care provider. Make sure you discuss any questions you have with your health care provider. Document Released: 02/14/2008 Document Revised: 01/06/2016 Document Reviewed: 01/06/2016 Elsevier Interactive Patient Education  2018 Elsevier Inc.  

## 2017-05-28 NOTE — Progress Notes (Signed)
Review of ULTRASOUND.    I have personally reviewed images and report of recent [facility].    Plan of management to be discussed with patient.  Twins risk factors for pregnancy d/w pt LEEP RF also discussed, cervical checks not needed as she had LEEP prior to first pregnancy. Desires no genetic testing  Annamarie MajorPaul Vincenta Steffey, MD, Merlinda FrederickFACOG [facility], [facility] 05/28/2017  2:51 PM

## 2017-05-29 LAB — DRUG SCREEN, URINE
AMPHETAMINES, URINE: NEGATIVE ng/mL
Barbiturate screen, urine: NEGATIVE ng/mL
Benzodiazepine Quant, Ur: NEGATIVE ng/mL
CANNABINOID QUANT UR: NEGATIVE ng/mL
COCAINE (METAB.): NEGATIVE ng/mL
Opiate Quant, Ur: NEGATIVE ng/mL
PCP Quant, Ur: NEGATIVE ng/mL

## 2017-05-30 LAB — RPR+RH+ABO+RUB AB+AB SCR+CB...
Antibody Screen: NEGATIVE
HIV SCREEN 4TH GENERATION: NONREACTIVE
Hematocrit: 37.3 % (ref 34.0–46.6)
Hemoglobin: 12.3 g/dL (ref 11.1–15.9)
Hepatitis B Surface Ag: NEGATIVE
MCH: 28.3 pg (ref 26.6–33.0)
MCHC: 33 g/dL (ref 31.5–35.7)
MCV: 86 fL (ref 79–97)
PLATELETS: 359 10*3/uL (ref 150–379)
RBC: 4.35 x10E6/uL (ref 3.77–5.28)
RDW: 13.2 % (ref 12.3–15.4)
RPR Ser Ql: NONREACTIVE
RUBELLA: 2.58 {index} (ref >0.99)
Rh Factor: POSITIVE
VARICELLA: 1038 {index} (ref >165)
WBC: 9.3 10*3/uL (ref 3.4–10.8)

## 2017-05-30 LAB — HEMOGLOBINOPATHY EVALUATION
HEMOGLOBIN A2 QUANTITATION: 2.3 % (ref 1.8–3.2)
HGB C: 0 %
HGB S: 0 %
HGB VARIANT: 0 %
Hemoglobin F Quantitation: 0.7 % (ref 0.0–2.0)
Hgb A: 97 % (ref 96.4–98.8)

## 2017-05-30 LAB — GLUCOSE, 1 HOUR GESTATIONAL: Gestational Diabetes Screen: 140 mg/dL — ABNORMAL HIGH (ref 65–139)

## 2017-06-13 ENCOUNTER — Encounter: Payer: Self-pay | Admitting: Maternal Newborn

## 2017-06-25 ENCOUNTER — Ambulatory Visit (INDEPENDENT_AMBULATORY_CARE_PROVIDER_SITE_OTHER): Payer: BC Managed Care – PPO | Admitting: Obstetrics and Gynecology

## 2017-06-25 ENCOUNTER — Encounter: Payer: Self-pay | Admitting: Obstetrics and Gynecology

## 2017-06-25 VITALS — BP 118/74 | Wt 182.0 lb

## 2017-06-25 DIAGNOSIS — Z6836 Body mass index (BMI) 36.0-36.9, adult: Secondary | ICD-10-CM

## 2017-06-25 DIAGNOSIS — O30049 Twin pregnancy, dichorionic/diamniotic, unspecified trimester: Secondary | ICD-10-CM

## 2017-06-25 DIAGNOSIS — O3441 Maternal care for other abnormalities of cervix, first trimester: Secondary | ICD-10-CM

## 2017-06-25 DIAGNOSIS — O0991 Supervision of high risk pregnancy, unspecified, first trimester: Secondary | ICD-10-CM

## 2017-06-25 DIAGNOSIS — Z3A11 11 weeks gestation of pregnancy: Secondary | ICD-10-CM

## 2017-06-25 MED ORDER — DOXYLAMINE SUCCINATE (SLEEP) 25 MG PO TABS: 25.0000 mg | ORAL_TABLET | Freq: Every day | ORAL | 3 refills | Status: DC

## 2017-06-25 MED ORDER — RANITIDINE HCL 150 MG PO TABS: 150.0000 mg | ORAL_TABLET | Freq: Two times a day (BID) | ORAL | 11 refills | Status: DC

## 2017-06-25 MED ORDER — PYRIDOXINE HCL 25 MG PO TABS: 25.0000 mg | ORAL_TABLET | Freq: Four times a day (QID) | ORAL | 3 refills | Status: DC | PRN

## 2017-06-25 NOTE — Progress Notes (Signed)
Routine Prenatal Care Visit  Subjective  Kayla Parrish is a 27 y.o. G2P1001 at 8061w0d being seen today for ongoing prenatal care.  She is currently monitored for the following issues for this high-risk pregnancy and has S/P cholecystectomy; Gastroesophageal reflux disease without esophagitis; Anemia; History of acute pancreatitis; Bilateral carpal tunnel syndrome; Supervision of normal pregnancy; BMI 36.0-36.9,adult; History of loop electrosurgical excision procedure (LEEP) affecting care of mother in first trimester, antepartum; Supervision of high risk pregnancy, antepartum, first trimester; Dizygotic twins; and Dichorionic diamniotic twin pregnancy, antepartum on their problem list.  ----------------------------------------------------------------------------------- Patient reports heart burn, reflux and indigestion.   Contractions: Not present. Vag. Bleeding: None.  Movement: Absent. Denies leaking of fluid.     Objective   Vitals:   06/25/17 1610  BP: 118/74  Weight: 182 lb (82.6 kg)   Pregravid Weight: 185 lb (83.9 kg)  Total Weight Gain:  (-1.361 kg)  Urinalysis: Urine Protein: Negative Urine Glucose: Negative  Fetal Status: Fetal Heart Rate (bpm): 155/165   Movement: Absent     General: Alert: Oriented and cooperative. Patient is in no acute distress. Skin: Skin is warm and dry. No rash noted.  Cardiovascular: Regular rate and rhythm. No murmurs, gallops, or rubs. Respiratory: Normal respiratory effort, no problems with respiration noted Abdomen: Soft, gravid, appropriate for gestational age. Pain/Pressure: Present Pelvic: Cervical exam deferred       Extremeties: Normal range of motion.    Mental Status: Normal mood and affect. Normal behavior. Normal judgment and thought content.  Assessment   27 y.o. G2P1001 at 7561w0d by  01/14/2018, by Last Menstrual Period presenting for routine prenatal visit  Plan   2nd Pregnancy Problems (from 05/17/17 to present)    Problem Noted Resolved   Dichorionic diamniotic twin pregnancy, antepartum 06/25/2017 by Natale MilchSchuman, Neomia Herbel R, MD No   Overview Signed 06/25/2017 11:51 PM by Natale MilchSchuman, Marquice Uddin R, MD    [ ]   Growth US every 3-4 weeks starting at 24 weeks [ ]  weekly NST/AFI or BPP starting at 36 weeks      Supervision of high risk pregnancy, antepartum, first trimester 05/28/2017 by Nadara MustardHarris, Robert P, MD No   Overview Signed 06/26/2017 12:00 AM by Natale MilchSchuman, Jeziah Kretschmer R, MD      [facility] Prenatal Labs  Dating  T1 US=LMP Blood type: O/Positive/-- (01/08 1511)   Genetic Screen  Declines   Antibody:Negative (01/08 1511)  Anatomic US  Rubella: 2.58 (01/08 1511) Varicella: @VZVIGG @  GTT Early: 140( 3hr gtt pending)  28 wk:      RPR: Non Reactive (01/08 1511)   Rhogam  not needed HBsAg: Negative (01/08 1511)   TDaP vaccine                       HIV: Non Reactive (01/08 1511)   Flu Shot   Declines                             GBS:   Contraception  Pap: Due April 2019  CBB     CS/VBAC    Baby Food    Support Person                           Preterm labor symptoms and general obstetric precautions including but not limited to vaginal bleeding, contractions, leaking of fluid and fetal movement were reviewed in detail with the patient.  Please refer to After Visit Summary for other counseling recommendations.   B6 and unisom for nausea, continue zofran GERD- will start zantac, if no improvement consider sucralfate  Return in about 4 weeks (around 07/23/2017) for ROB.  Adelene Idler M.D. 06/25/17 4:35 PM

## 2017-06-25 NOTE — Progress Notes (Signed)
No vb. No lof.  

## 2017-06-27 ENCOUNTER — Other Ambulatory Visit: Payer: BC Managed Care – PPO

## 2017-06-27 ENCOUNTER — Other Ambulatory Visit: Payer: Self-pay | Admitting: Maternal Newborn

## 2017-06-27 DIAGNOSIS — R7309 Other abnormal glucose: Secondary | ICD-10-CM

## 2017-06-28 LAB — GESTATIONAL GLUCOSE TOLERANCE
GLUCOSE 1 HOUR GTT: 102 mg/dL (ref 65–179)
GLUCOSE FASTING: 75 mg/dL (ref 65–94)
Glucose, GTT - 2 Hour: 110 mg/dL (ref 65–154)
Glucose, GTT - 3 Hour: 107 mg/dL (ref 65–139)

## 2017-07-23 ENCOUNTER — Ambulatory Visit (INDEPENDENT_AMBULATORY_CARE_PROVIDER_SITE_OTHER): Payer: BC Managed Care – PPO | Admitting: Obstetrics & Gynecology

## 2017-07-23 VITALS — BP 110/70 | Wt 180.0 lb

## 2017-07-23 DIAGNOSIS — O30049 Twin pregnancy, dichorionic/diamniotic, unspecified trimester: Secondary | ICD-10-CM

## 2017-07-23 DIAGNOSIS — Z3A15 15 weeks gestation of pregnancy: Secondary | ICD-10-CM

## 2017-07-23 DIAGNOSIS — O0991 Supervision of high risk pregnancy, unspecified, first trimester: Secondary | ICD-10-CM

## 2017-07-23 DIAGNOSIS — O3441 Maternal care for other abnormalities of cervix, first trimester: Secondary | ICD-10-CM

## 2017-07-23 NOTE — Progress Notes (Signed)
  Subjective  Fetal Movement? yes Contractions? no Leaking Fluid? no Vaginal Bleeding? No Twins, no c/os. Prior LEEP but normal pregnancy since Min nausea now Objective  BP 110/70   Wt 180 lb (81.6 kg)   LMP 04/09/2017 (Exact Date)   BMI 35.15 kg/m  General: NAD Pumonary: no increased work of breathing Abdomen: gravid, non-tender Extremities: no edema Psychiatric: mood appropriate, affect full  Assessment  26 y.o. G2P1001 at 4010w0d by  01/14/2018, by Last Menstrual Period presenting for routine prenatal visit  Plan   Problem List Items Addressed This Visit      Other   History of loop electrosurgical excision procedure (LEEP) affecting care of mother in first trimester, antepartum   Supervision of high risk pregnancy, antepartum, first trimester   Relevant Orders   US OB Comp + 14 Wk   Dichorionic diamniotic twin pregnancy, antepartum   Relevant Orders   US OB Comp + 14 Wk    Other Visit Diagnoses    [redacted] weeks gestation of pregnancy    -  Primary   Relevant Orders   US OB Comp + 14 Wk    FHT x2 today US nv  Annamarie MajorPaul Jamauri Kruzel, MD, Merlinda FrederickFACOG [facility], [facility] 07/23/2017  4:50 PM

## 2017-07-23 NOTE — Patient Instructions (Signed)
Multiple Pregnancy Having a multiple pregnancy means that a woman is carrying more than one baby at a time. She may be pregnant with twins, triplets, or more. The majority of multiple pregnancies are twins. Naturally conceiving triplets or more (higher-order multiples) is rare. Multiple pregnancies are riskier than single pregnancies. A woman with a multiple pregnancy is more likely to have certain problems during her pregnancy. Therefore, she will need to have more frequent appointments for prenatal care. How does a multiple pregnancy happen? A multiple pregnancy happens when:  The woman's body releases more than one egg at a time, and then each egg gets fertilized by a different sperm. ? This is the most common type of multiple pregnancy. ? Twins or other multiples produced this way are fraternal. They are no more alike than non-multiple siblings are.  One sperm fertilizes one egg, which then divides into more than one embryo. ? Twins or other multiples produced this way are identical. Identical multiples are always the same gender, and they look very much alike.  Who is most likely to have a multiple pregnancy? A multiple pregnancy is more likely to develop in women who:  Have had fertility treatment, especially if the treatment included fertility drugs.  Are older than 29 years of age.  Have already had four or more children.  Have a family history of multiple pregnancy.  How is a multiple pregnancy diagnosed? A multiple pregnancy may be diagnosed based on:  Symptoms such as: ? Rapid weight gain in the first 3 months of pregnancy (first trimester). ? More severe nausea and breast tenderness than what is typical of a single pregnancy. ? The uterus measuring larger than what is normal for the stage of the pregnancy.  Blood tests that detect a higher-than-normal level of human chorionic gonadotropin (hCG). This is a hormone that your body produces in early pregnancy.  Ultrasound  exam. This is used to confirm that you are carrying multiples.  What risks are associated with multiple pregnancy? A multiple pregnancy puts you at a higher risk for certain problems during or after your pregnancy, including:  Having your babies delivered before you have reached a full-term pregnancy (preterm birth). A full-term pregnancy lasts for at least 37 weeks. Babies born before 53 weeks may have a higher risk of a variety of health problems, such as breathing problems, feeding difficulties, cerebral palsy, and learning disabilities.  Diabetes.  Preeclampsia. This is a serious condition that causes high blood pressure along with other symptoms, such as swelling and headaches, during pregnancy.  Excessive blood loss after childbirth (postpartum hemorrhage).  Postpartum depression.  Low birth weight of the babies.  How will having a multiple pregnancy affect my care? Your health care provider will want to monitor you more closely during your pregnancy to make sure that your babies are growing normally and that you are healthy. Follow these instructions at home: Because your pregnancy is considered to be high risk, you will need to work closely with your health care team. You may also need to make some lifestyle changes. These may include the following: Eating and drinking  Increase your nutrition. ? Follow your health care provider's recommendations for weight gain. You may need to gain a little extra weight when you are pregnant with multiples. ? Eat healthy snacks often throughout the day. This can add calories and reduce nausea.  Drink enough fluid to keep your urine clear or pale yellow.  Take prenatal vitamins. Activity By 20-24 weeks, you may  need to limit your activities.  Avoid activities and work that take a lot of effort (are strenuous).  Ask your health care provider when you should stop having sexual intercourse.  Rest often.  General instructions  Do not use  any products that contain nicotine or tobacco, such as cigarettes and e-cigarettes. If you need help quitting, ask your health care provider.  Do not drink alcohol or use illegal drugs.  Take over-the-counter and prescription medicines only as told by your health care provider.  Arrange for extra help around the house.  Keep all follow-up visits and all prenatal visits as told by your health care provider. This is important. Contact a health care provider if:  You have dizziness.  You have persistent nausea, vomiting, or diarrhea.  You are having trouble gaining weight.  You have feelings of depression or other emotions that are interfering with your normal activities. Get help right away if:  You have a fever.  You have pain with urination.  You have fluid leaking from your vagina.  You have a bad-smelling vaginal discharge.  You notice increased swelling in your face, hands, legs, or ankles.  You have spotting or bleeding from your vagina.  You have pelvic cramps, pelvic pressure, or nagging pain in your abdomen or lower back.  You are having regular contractions.  You develop a severe headache, with or without visual changes.  You have shortness of breath or chest pain.  You notice less fetal movement, or no fetal movement. This information is not intended to replace advice given to you by your health care provider. Make sure you discuss any questions you have with your health care provider. Document Released: 02/14/2008 Document Revised: 01/06/2016 Document Reviewed: 01/06/2016 Elsevier Interactive Patient Education  2018 Elsevier Inc.  

## 2017-07-30 ENCOUNTER — Ambulatory Visit (INDEPENDENT_AMBULATORY_CARE_PROVIDER_SITE_OTHER): Payer: BC Managed Care – PPO | Admitting: Advanced Practice Midwife

## 2017-07-30 ENCOUNTER — Encounter: Payer: Self-pay | Admitting: Advanced Practice Midwife

## 2017-07-30 ENCOUNTER — Encounter: Payer: Self-pay | Admitting: Obstetrics & Gynecology

## 2017-07-30 VITALS — BP 106/68 | Temp 98.5°F | Wt 180.0 lb

## 2017-07-30 DIAGNOSIS — Z3A16 16 weeks gestation of pregnancy: Secondary | ICD-10-CM

## 2017-07-30 DIAGNOSIS — O9989 Other specified diseases and conditions complicating pregnancy, childbirth and the puerperium: Secondary | ICD-10-CM

## 2017-07-30 DIAGNOSIS — M549 Dorsalgia, unspecified: Secondary | ICD-10-CM | POA: Diagnosis not present

## 2017-07-30 LAB — POCT URINALYSIS DIPSTICK
BILIRUBIN UA: NEGATIVE
Glucose, UA: NEGATIVE
Leukocytes, UA: NEGATIVE
Nitrite, UA: NEGATIVE
Odor: NORMAL
PH UA: 7 (ref 5.0–8.0)
RBC UA: NEGATIVE
SPEC GRAV UA: 1.015 (ref 1.010–1.025)
UROBILINOGEN UA: 0.2 U/dL

## 2017-07-30 NOTE — Progress Notes (Signed)
uaLow Back pain started around 7-8pm last night. She didn't sleep well. Couldn't get comfortable. "Feels like back labor"

## 2017-07-30 NOTE — Patient Instructions (Signed)
Back Pain in Pregnancy Back pain during pregnancy is common. Back pain may be caused by several factors that are related to changes during your pregnancy. Follow these instructions at home: Managing pain, stiffness, and swelling  If directed, apply ice for sudden (acute) back pain. ? Put ice in a plastic bag. ? Place a towel between your skin and the bag. ? Leave the ice on for 20 minutes, 2-3 times per day.  If directed, apply heat to the affected area before you exercise: ? Place a towel between your skin and the heat pack or heating pad. ? Leave the heat on for 20-30 minutes. ? Remove the heat if your skin turns bright red. This is especially important if you are unable to feel pain, heat, or cold. You may have a greater risk of getting burned. Activity  Exercise as told by your health care provider. Exercising is the best way to prevent or manage back pain.  Listen to your body when lifting. If lifting hurts, ask for help or bend your knees. This uses your leg muscles instead of your back muscles.  Squat down when picking up something from the floor. Do not bend over.  Only use bed rest as told by your health care provider. Bed rest should only be used for the most severe episodes of back pain. Standing, Sitting, and Lying Down  Do not stand in one place for long periods of time.  Use good posture when sitting. Make sure your head rests over your shoulders and is not hanging forward. Use a pillow on your lower back if necessary.  Try sleeping on your side, preferably the left side, with a pillow or two between your legs. If you are sore after a night's rest, your bed may be too soft. A firm mattress may provide more support for your back during pregnancy. General instructions  Do not wear high heels.  Eat a healthy diet. Try to gain weight within your health care provider's recommendations.  Use a maternity girdle, elastic sling, or back brace as told by your health care  provider.  Take over-the-counter and prescription medicines only as told by your health care provider.  Keep all follow-up visits as told by your health care provider. This is important. This includes any visits with any specialists, such as a physical therapist. Contact a health care provider if:  Your back pain interferes with your daily activities.  You have increasing pain in other parts of your body. Get help right away if:  You develop numbness, tingling, weakness, or problems with the use of your arms or legs.  You develop severe back pain that is not controlled with medicine.  You have a sudden change in bowel or bladder control.  You develop shortness of breath, dizziness, or you faint.  You develop nausea, vomiting, or sweating.  You have back pain that is a rhythmic, cramping pain similar to labor pains. Labor pain is usually 1-2 minutes apart, lasts for about 1 minute, and involves a bearing down feeling or pressure in your pelvis.  You have back pain and your water breaks or you have vaginal bleeding.  You have back pain or numbness that travels down your leg.  Your back pain developed after you fell.  You develop pain on one side of your back.  You see blood in your urine.  You develop skin blisters in the area of your back pain. This information is not intended to replace advice given to you   by your health care provider. Make sure you discuss any questions you have with your health care provider. Document Released: 08/15/2005 Document Revised: 10/13/2015 Document Reviewed: 01/19/2015 Elsevier Interactive Patient Education  2018 Elsevier Inc.  

## 2017-07-30 NOTE — Progress Notes (Signed)
Routine Prenatal Care Visit  Subjective  Kayla Parrish is a 27 y.o. G2P1001 at 9263w0d being seen today for ongoing prenatal care.  She is currently monitored for the following issues for this high-risk pregnancy and has S/P cholecystectomy; Gastroesophageal reflux disease without esophagitis; Anemia; History of acute pancreatitis; Bilateral carpal tunnel syndrome; BMI 36.0-36.9,adult; History of loop electrosurgical excision procedure (LEEP) affecting care of mother in first trimester, antepartum; Supervision of high risk pregnancy, antepartum, first trimester; Dizygotic twins; and Dichorionic diamniotic twin pregnancy, antepartum on their problem list.  ----------------------------------------------------------------------------------- Patient reports backache.  She describes the pain as constant and worsening since last night. She denies flank pain. Contractions: Not present. Vag. Bleeding: None.  Movement: Present. Denies leaking of fluid.  ----------------------------------------------------------------------------------- The following portions of the patient's history were reviewed and updated as appropriate: allergies, current medications, past family history, past medical history, past social history, past surgical history and problem list. Problem list updated.   Objective  Blood pressure 106/68, temperature 98.5 F (36.9 C), weight 180 lb (81.6 kg), last menstrual period 04/09/2017. Pregravid weight 185 lb (83.9 kg) Total Weight Gain  (-2.268 kg) Urinalysis: Urine Protein: Negative Urine Glucose: Negative Trace ketones  Fetal Status: Fetal Heart Rate (bpm): 160/167   Movement: Present     General:  Alert, oriented and cooperative. Patient is in no acute distress.  Skin: Skin is warm and dry. No rash noted.   Cardiovascular: Normal heart rate noted  Respiratory: Normal respiratory effort, no problems with respiration noted  Abdomen: Soft, gravid, appropriate for gestational age.  Pain/Pressure: Absent   No CVAT  Pelvic:  Cervical exam deferred        Extremities: Normal range of motion.     Mental Status: Normal mood and affect. Normal behavior. Normal judgment and thought content.   Assessment   27 y.o. G2P1001 at 1563w0d by  01/14/2018, by Last Menstrual Period presenting for work-in prenatal visit  Plan   2nd Pregnancy Problems (from 05/17/17 to present)    Problem Noted Resolved   Dichorionic diamniotic twin pregnancy, antepartum 06/25/2017 by Natale MilchSchuman, Christanna R, MD No   Overview Signed 06/25/2017 11:51 PM by Natale MilchSchuman, Christanna R, MD    [ ]   Growth US every 3-4 weeks starting at 24 weeks [ ]  weekly NST/AFI or BPP starting at 36 weeks      Supervision of high risk pregnancy, antepartum, first trimester 05/28/2017 by Nadara MustardHarris, Robert P, MD No   Overview Addendum 07/05/2017  2:26 PM by Oswaldo ConroySchmid, Jacelyn Y, CNM      [facility] Prenatal Labs  Dating  T1 US=LMP Blood type: O/Positive/-- (01/08 1511)   Genetic Screen  Declines   Antibody:Negative (01/08 1511)  Anatomic US  Rubella: 2.58 (01/08 1511) Varicella: @VZVIGG @  GTT Early: 140 3hr gtt=normal 28 wk:      RPR: Non Reactive (01/08 1511)   Rhogam  not needed HBsAg: Negative (01/08 1511)   TDaP vaccine                       HIV: Non Reactive (01/08 1511)   Flu Shot   Declines                             GBS:   Contraception  Pap: Due April 2019  CBB     CS/VBAC    Baby Food    Support Person  Supervision of normal pregnancy 05/17/2017 by Oswaldo Conroy, CNM 06/25/2017 by Natale Milch, MD   Overview Signed 05/17/2017 10:02 AM by Oswaldo Conroy, CNM    [facility] Prenatal Labs  Dating  Blood type:     Genetic Screen 1 Screen:    AFP:     Quad:     NIPS: Antibody:   Anatomic [place]  Rubella:   Varicella:    GTT Early:               Third trimester:  RPR:     Rhogam  HBsAg:     TDaP vaccine                       Flu Shot: HIV:     Baby Food                                 GBS:   Contraception  Pap:  CBB     CS/VBAC    Support Person                  Preterm labor symptoms and general obstetric precautions including but not limited to vaginal bleeding, contractions, leaking of fluid and fetal movement were reviewed in detail with the patient. Please refer to After Visit Summary for other counseling recommendations.  Recommended comfort measures for back pain in pregnancy- stretching, exercises, abdominal support band, heat, hydrotherapy  Return for has f/u visit already scheduled.  Tresea Mall, CNM 07/30/2017 2:05 PM

## 2017-08-08 ENCOUNTER — Other Ambulatory Visit: Payer: Self-pay | Admitting: Obstetrics & Gynecology

## 2017-08-08 DIAGNOSIS — O0991 Supervision of high risk pregnancy, unspecified, first trimester: Secondary | ICD-10-CM

## 2017-08-08 DIAGNOSIS — Z3A15 15 weeks gestation of pregnancy: Secondary | ICD-10-CM

## 2017-08-08 DIAGNOSIS — O30049 Twin pregnancy, dichorionic/diamniotic, unspecified trimester: Secondary | ICD-10-CM

## 2017-08-08 DIAGNOSIS — Z9889 Other specified postprocedural states: Secondary | ICD-10-CM

## 2017-08-13 ENCOUNTER — Encounter: Payer: BC Managed Care – PPO | Admitting: Obstetrics & Gynecology

## 2017-08-13 ENCOUNTER — Other Ambulatory Visit: Payer: BC Managed Care – PPO

## 2017-08-14 ENCOUNTER — Ambulatory Visit (INDEPENDENT_AMBULATORY_CARE_PROVIDER_SITE_OTHER): Payer: BC Managed Care – PPO

## 2017-08-14 ENCOUNTER — Ambulatory Visit (INDEPENDENT_AMBULATORY_CARE_PROVIDER_SITE_OTHER): Payer: BC Managed Care – PPO | Admitting: Obstetrics & Gynecology

## 2017-08-14 VITALS — BP 120/80 | Wt 181.0 lb

## 2017-08-14 DIAGNOSIS — O30049 Twin pregnancy, dichorionic/diamniotic, unspecified trimester: Secondary | ICD-10-CM

## 2017-08-14 DIAGNOSIS — Z3A15 15 weeks gestation of pregnancy: Secondary | ICD-10-CM

## 2017-08-14 DIAGNOSIS — Z9889 Other specified postprocedural states: Secondary | ICD-10-CM

## 2017-08-14 DIAGNOSIS — Z3A18 18 weeks gestation of pregnancy: Secondary | ICD-10-CM

## 2017-08-14 DIAGNOSIS — O0991 Supervision of high risk pregnancy, unspecified, first trimester: Secondary | ICD-10-CM | POA: Diagnosis not present

## 2017-08-14 DIAGNOSIS — O0992 Supervision of high risk pregnancy, unspecified, second trimester: Secondary | ICD-10-CM

## 2017-08-14 NOTE — Progress Notes (Signed)
  Subjective  Fetal Movement? yes Contractions? no Leaking Fluid? no Vaginal Bleeding? no  Objective  BP 120/80   Wt 181 lb (82.1 kg)   LMP 04/09/2017 (Exact Date)   BMI 35.35 kg/m  General: NAD Pumonary: no increased work of breathing Abdomen: gravid, non-tender Extremities: no edema Psychiatric: mood appropriate, affect full  Assessment  27 y.o. G2P1001 at 2987w1d by  01/14/2018, by Last Menstrual Period presenting for routine prenatal visit  Plan   Problem List Items Addressed This Visit      Other   Supervision of high risk pregnancy, antepartum, first trimester   Dichorionic diamniotic twin pregnancy, antepartum    Other Visit Diagnoses    [redacted] weeks gestation of pregnancy    -  Primary    Review of ULTRASOUND. I have personally reviewed images and report of recent [facility]. There is a singleton gestation with subjectively normal amniotic fluid volume. The fetal biometry correlates with established dating. Detailed evaluation of the fetal anatomy was performed.The fetal anatomical survey appears within normal limits within the resolution of ultrasound as described above.  It must be noted that a normal ultrasound is unable to rule out fetal aneuploidy.  Boy and Girl Twins!   Normal cervical length.  Kayla MajorPaul Demarious Kapur, MD, Merlinda FrederickFACOG [facility], [facility] 08/14/2017  2:47 PM

## 2017-09-11 ENCOUNTER — Ambulatory Visit (INDEPENDENT_AMBULATORY_CARE_PROVIDER_SITE_OTHER): Payer: BC Managed Care – PPO | Admitting: Obstetrics and Gynecology

## 2017-09-11 VITALS — BP 102/64 | Wt 181.0 lb

## 2017-09-11 DIAGNOSIS — O0991 Supervision of high risk pregnancy, unspecified, first trimester: Secondary | ICD-10-CM

## 2017-09-11 DIAGNOSIS — O30049 Twin pregnancy, dichorionic/diamniotic, unspecified trimester: Secondary | ICD-10-CM

## 2017-09-11 DIAGNOSIS — Z3A22 22 weeks gestation of pregnancy: Secondary | ICD-10-CM

## 2017-09-11 DIAGNOSIS — IMO0002 Reserved for concepts with insufficient information to code with codable children: Secondary | ICD-10-CM

## 2017-09-11 NOTE — Progress Notes (Signed)
Routine Prenatal Care Visit  Subjective  Kayla Parrish is a 27 y.o. G2P1001 at [redacted]w[redacted]d being seen today for ongoing prenatal care.  She is currently monitored for the following issues for this high-risk pregnancy and has S/P cholecystectomy; Gastroesophageal reflux disease without esophagitis; Anemia; History of acute pancreatitis; Bilateral carpal tunnel syndrome; BMI 36.0-36.9,adult; History of loop electrosurgical excision procedure (LEEP) affecting care of mother in first trimester, antepartum; Supervision of high risk pregnancy, antepartum, first trimester; Dizygotic twins; and Dichorionic diamniotic twin pregnancy, antepartum on their problem list.  ----------------------------------------------------------------------------------- Patient reports no complaints.   Contractions: Not present. Vag. Bleeding: None.  Movement: Present. Denies leaking of fluid.  ----------------------------------------------------------------------------------- The following portions of the patient's history were reviewed and updated as appropriate: allergies, current medications, past family history, past medical history, past social history, past surgical history and problem list. Problem list updated.   Objective  Blood pressure 102/64, weight 181 lb (82.1 kg), last menstrual period 04/09/2017. Pregravid weight 185 lb (83.9 kg) Total Weight Gain -4 lb (-1.814 kg) Urinalysis: Urine Protein: 1+ Urine Glucose: Negative  Fetal Status: Fetal Heart Rate (bpm): 130/130 Fundal Height: 29 cm Movement: Present     General:  Alert, oriented and cooperative. Patient is in no acute distress.  Skin: Skin is warm and dry. No rash noted.   Cardiovascular: Normal heart rate noted  Respiratory: Normal respiratory effort, no problems with respiration noted  Abdomen: Soft, gravid, appropriate for gestational age. Pain/Pressure: Absent     Pelvic:  Cervical exam deferred        Extremities: Normal range of motion.       ental Status: Normal mood and affect. Normal behavior. Normal judgment and thought content.     Assessment   27 y.o. G2P1001 at [redacted]w[redacted]d by  01/14/2018, by Last Menstrual Period presenting for routine prenatal visit  Plan   2nd Pregnancy Problems (from 05/17/17 to present)    Problem Noted Resolved   Dichorionic diamniotic twin pregnancy, antepartum 06/25/2017 by Natale Milch, MD No   Overview Signed 06/25/2017 11:51 PM by Natale Milch, MD    [ ]   Growth [place] every 3-4 weeks starting at 24 weeks [ ]  weekly NST/AFI or BPP starting at 36 weeks      Supervision of high risk pregnancy, antepartum, first trimester 05/28/2017 by Nadara Mustard, MD No   Overview Addendum 09/11/2017  4:56 PM by Natale Milch, MD      Clinic [facility] Prenatal Labs  Dating  T1 US=LMP Blood type: O/Positive/-- (01/08 1511)   Genetic Screen  Declines   Antibody:Negative (01/08 1511)  Anatomic [place]  Rubella: 2.58 (01/08 1511) Varicella: Immune  GTT Early: 140 3hr gtt=normal 28 wk:      RPR: Non Reactive (01/08 1511)   Rhogam  not needed HBsAg: Negative (01/08 1511)   TDaP vaccine                       HIV: Non Reactive (01/08 1511)   Flu Shot   Declines                             GBS:   Contraception  IUD Pap: Due April 2019  CBB   given information   CS/VBAC    Baby Food "going to try pumping"   Support Person  Gestational age appropriate obstetric precautions including but not limited to vaginal bleeding, contractions, leaking of fluid and fetal movement were reviewed in detail with the patient.    Return in about 2 weeks (around 09/25/2017) for ROB and growth US.  Adelene Idlerhristanna Zyaira Vejar MD [facility], [facility] 09/11/2017, 4:57 PM

## 2017-09-11 NOTE — Progress Notes (Signed)
Pt reports no problems.   

## 2017-09-26 ENCOUNTER — Encounter: Payer: Self-pay | Admitting: Advanced Practice Midwife

## 2017-09-26 ENCOUNTER — Other Ambulatory Visit: Payer: Self-pay | Admitting: Obstetrics and Gynecology

## 2017-09-26 ENCOUNTER — Ambulatory Visit (INDEPENDENT_AMBULATORY_CARE_PROVIDER_SITE_OTHER): Payer: BC Managed Care – PPO

## 2017-09-26 ENCOUNTER — Ambulatory Visit (INDEPENDENT_AMBULATORY_CARE_PROVIDER_SITE_OTHER): Payer: BC Managed Care – PPO | Admitting: Advanced Practice Midwife

## 2017-09-26 VITALS — BP 110/68 | Wt 186.0 lb

## 2017-09-26 DIAGNOSIS — Z3A24 24 weeks gestation of pregnancy: Secondary | ICD-10-CM

## 2017-09-26 DIAGNOSIS — Z130 Encounter for screening for diseases of the blood and blood-forming organs and certain disorders involving the immune mechanism: Secondary | ICD-10-CM

## 2017-09-26 DIAGNOSIS — Z131 Encounter for screening for diabetes mellitus: Secondary | ICD-10-CM

## 2017-09-26 DIAGNOSIS — O0991 Supervision of high risk pregnancy, unspecified, first trimester: Secondary | ICD-10-CM

## 2017-09-26 DIAGNOSIS — O30049 Twin pregnancy, dichorionic/diamniotic, unspecified trimester: Secondary | ICD-10-CM | POA: Diagnosis not present

## 2017-09-26 DIAGNOSIS — IMO0002 Reserved for concepts with insufficient information to code with codable children: Secondary | ICD-10-CM

## 2017-09-26 DIAGNOSIS — Z113 Encounter for screening for infections with a predominantly sexual mode of transmission: Secondary | ICD-10-CM

## 2017-09-26 NOTE — Progress Notes (Signed)
No vb. No lof. U/s today.  

## 2017-09-26 NOTE — Progress Notes (Signed)
Routine Prenatal Care Visit  Subjective  Kayla Parrish is a 27 y.o. G2P1001 at [redacted]w[redacted]d being seen today for ongoing prenatal care.  She is currently monitored for the following issues for this high-risk pregnancy and has S/P cholecystectomy; Gastroesophageal reflux disease without esophagitis; Anemia; History of acute pancreatitis; Bilateral carpal tunnel syndrome; BMI 36.0-36.9,adult; History of loop electrosurgical excision procedure (LEEP) affecting care of mother in first trimester, antepartum; Supervision of high risk pregnancy, antepartum, first trimester; Dizygotic twins; and Dichorionic diamniotic twin pregnancy, antepartum on their problem list.  ----------------------------------------------------------------------------------- Patient reports no complaints.   Contractions: Not present. Vag. Bleeding: None.  Movement: Present. Denies leaking of fluid.  ----------------------------------------------------------------------------------- The following portions of the patient's history were reviewed and updated as appropriate: allergies, current medications, past family history, past medical history, past social history, past surgical history and problem list. Problem list updated.   Objective  Blood pressure 110/68, weight 186 lb (84.4 kg), last menstrual period 04/09/2017. Pregravid weight 185 lb (83.9 kg) Total Weight Gain 1 lb (0.454 kg) Urinalysis:      Fetal Status: Fetal Heart Rate (bpm): 158/141   Movement: Present  Presentation: A: Vertex/B: Transverse A: maternal Left, growth 44% 1 pound 8 ounces, MVP 3.79 B: maternal Right, growth 55%, 1 pound 10 ounces, MVP 3.16  General:  Alert, oriented and cooperative. Patient is in no acute distress.  Skin: Skin is warm and dry. No rash noted.   Cardiovascular: Normal heart rate noted  Respiratory: Normal respiratory effort, no problems with respiration noted  Abdomen: Soft, gravid, appropriate for gestational age. Pain/Pressure:  Absent     Pelvic:  Cervical exam deferred        Extremities: Normal range of motion.     Mental Status: Normal mood and affect. Normal behavior. Normal judgment and thought content.   Assessment   27 y.o. G2P1001 at [redacted]w[redacted]d by  01/14/2018, by Last Menstrual Period presenting for routine prenatal visit  Plan   2nd Pregnancy Problems (from 05/17/17 to present)    Problem Noted Resolved   Dichorionic diamniotic twin pregnancy, antepartum 06/25/2017 by Natale Milch, MD No   Overview Signed 06/25/2017 11:51 PM by Natale Milch, MD      Growth [place] every 3-4 weeks starting at 24 weeks  weekly NST/AFI or BPP starting at 36 weeks      Supervision of high risk pregnancy, antepartum, first trimester 05/28/2017 by Nadara Mustard, MD No   Overview Addendum 09/11/2017  4:56 PM by Natale Milch, MD      [facility]inic [facility] Prenatal Labs  Dating  T1 US=LMP Blood type: O/Positive/-- (01/08 1511)   Genetic Screen  Declines   Antibody:Negative (01/08 1511)  Anatomic [place]  Rubella: 2.58 (01/08 1511) Varicella: Immune  GTT Early: 140 3hr gtt=normal 28 wk:      RPR: Non Reactive (01/08 1511)   Rhogam  not needed HBsAg: Negative (01/08 1511)   TDaP vaccine                       HIV: Non Reactive (01/08 1511)   Flu Shot   Declines                             GBS:   Contraception  IUD Pap: Due April 2019  CBB   given information   CS/VBAC    Baby Food "going to try pumping"   Support Person  Supervision of normal pregnancy 05/17/2017 by Oswaldo Conroy, CNM 06/25/2017 by Natale Milch, MD   Overview Signed 05/17/2017 10:02 AM by Oswaldo Conroy, CNM    [facility]inic [facility] Prenatal Labs  Dating  Blood type:     Genetic Screen 1 Screen:    AFP:     Quad:     NIPS: Antibody:   Anatomic [place]  Rubella:   Varicella:    GTT Early:               Third trimester:  RPR:     Rhogam  HBsAg:     TDaP vaccine                       Flu Shot: HIV:     Baby Food                                 GBS:   Contraception  Pap:  CBB     CS/VBAC    Support Person                  Preterm labor symptoms and general obstetric precautions including but not limited to vaginal bleeding, contractions, leaking of fluid and fetal movement were reviewed in detail with the patient.    Return in about 1 month (around 10/24/2017) for growth scan and 28 wk labs and rob.  Tresea Mall, CNM 09/26/2017 3:48 PM

## 2017-10-24 ENCOUNTER — Encounter: Payer: Self-pay | Admitting: Certified Nurse Midwife

## 2017-10-24 ENCOUNTER — Ambulatory Visit (INDEPENDENT_AMBULATORY_CARE_PROVIDER_SITE_OTHER): Payer: BC Managed Care – PPO

## 2017-10-24 ENCOUNTER — Ambulatory Visit (INDEPENDENT_AMBULATORY_CARE_PROVIDER_SITE_OTHER): Payer: BC Managed Care – PPO | Admitting: Certified Nurse Midwife

## 2017-10-24 ENCOUNTER — Other Ambulatory Visit: Payer: BC Managed Care – PPO

## 2017-10-24 VITALS — BP 102/66 | HR 107 | Wt 184.0 lb

## 2017-10-24 DIAGNOSIS — O30049 Twin pregnancy, dichorionic/diamniotic, unspecified trimester: Secondary | ICD-10-CM

## 2017-10-24 DIAGNOSIS — O0991 Supervision of high risk pregnancy, unspecified, first trimester: Secondary | ICD-10-CM | POA: Diagnosis not present

## 2017-10-24 DIAGNOSIS — Z113 Encounter for screening for infections with a predominantly sexual mode of transmission: Secondary | ICD-10-CM

## 2017-10-24 DIAGNOSIS — IMO0002 Reserved for concepts with insufficient information to code with codable children: Secondary | ICD-10-CM

## 2017-10-24 DIAGNOSIS — Z3A28 28 weeks gestation of pregnancy: Secondary | ICD-10-CM

## 2017-10-24 DIAGNOSIS — Z130 Encounter for screening for diseases of the blood and blood-forming organs and certain disorders involving the immune mechanism: Secondary | ICD-10-CM

## 2017-10-24 DIAGNOSIS — Z131 Encounter for screening for diabetes mellitus: Secondary | ICD-10-CM

## 2017-10-24 NOTE — Progress Notes (Signed)
Pt feels like heart rate has been elevated leading to shortness of breath and fatigue. 28 wk labs today. Growth u/s today.

## 2017-10-25 LAB — 28 WEEK RH+PANEL
Basophils Absolute: 0 10*3/uL (ref 0.0–0.2)
Basos: 0 %
EOS (ABSOLUTE): 0.1 10*3/uL (ref 0.0–0.4)
EOS: 1 %
Gestational Diabetes Screen: 103 mg/dL (ref 65–139)
HEMATOCRIT: 29.3 % — AB (ref 34.0–46.6)
HEMOGLOBIN: 9.6 g/dL — AB (ref 11.1–15.9)
HIV SCREEN 4TH GENERATION: NONREACTIVE
Immature Grans (Abs): 0.2 10*3/uL — ABNORMAL HIGH (ref 0.0–0.1)
Immature Granulocytes: 2 %
LYMPHS ABS: 1.7 10*3/uL (ref 0.7–3.1)
Lymphs: 17 %
MCH: 27.1 pg (ref 26.6–33.0)
MCHC: 32.8 g/dL (ref 31.5–35.7)
MCV: 83 fL (ref 79–97)
MONOCYTES: 10 %
Monocytes Absolute: 1 10*3/uL — ABNORMAL HIGH (ref 0.1–0.9)
NEUTROS ABS: 7.5 10*3/uL — AB (ref 1.4–7.0)
Neutrophils: 70 %
Platelets: 275 10*3/uL (ref 150–450)
RBC: 3.54 x10E6/uL — ABNORMAL LOW (ref 3.77–5.28)
RDW: 14 % (ref 12.3–15.4)
RPR Ser Ql: NONREACTIVE
WBC: 10.4 10*3/uL (ref 3.4–10.8)

## 2017-10-27 NOTE — Progress Notes (Signed)
HROB with twin gestation at 28wk2d: Had 28 week labs today Complains of palpitations and SOB at times, not related to exertion. SOB last 5 minutes or so, resolves spontaneously. No chest pain. Resting AP heart rate now 104 BPM. Heart: Mild tachycardia without murmur. No history of heart problems ? Anemia-will see if anemic today. If not anemic, consider referral to cardiology. If anemic, treat with iron and if symptoms do not improve-send to [facility] Growth scan today: Twin A in LLQ 127 CGA 28 wk4d, breech, MVP 3.08cm, 2#13oz (59th%) Twin B: 138 RUQ CGA 28 wk 3d, transverse, MVP 3.86cm, 2#8oz, 44th% Good FM ROB in 2 weeks. Kayla Parrish, CNM

## 2017-11-07 ENCOUNTER — Ambulatory Visit (INDEPENDENT_AMBULATORY_CARE_PROVIDER_SITE_OTHER): Payer: BC Managed Care – PPO | Admitting: Obstetrics & Gynecology

## 2017-11-07 VITALS — BP 110/70 | Wt 187.0 lb

## 2017-11-07 DIAGNOSIS — O0991 Supervision of high risk pregnancy, unspecified, first trimester: Secondary | ICD-10-CM

## 2017-11-07 DIAGNOSIS — Z23 Encounter for immunization: Secondary | ICD-10-CM | POA: Diagnosis not present

## 2017-11-07 DIAGNOSIS — Z3A30 30 weeks gestation of pregnancy: Secondary | ICD-10-CM

## 2017-11-07 DIAGNOSIS — O30049 Twin pregnancy, dichorionic/diamniotic, unspecified trimester: Secondary | ICD-10-CM

## 2017-11-07 DIAGNOSIS — O30043 Twin pregnancy, dichorionic/diamniotic, third trimester: Secondary | ICD-10-CM

## 2017-11-07 DIAGNOSIS — O0993 Supervision of high risk pregnancy, unspecified, third trimester: Secondary | ICD-10-CM

## 2017-11-07 NOTE — Progress Notes (Signed)
Prenatal Visit Note Date: 11/07/2017 Clinic: [facility]  Subjective:  Kayla Parrish is a 27 y.o. G2P1001 at 7624w2d being seen today for ongoing prenatal care.  She is currently monitored for the following issues for this high-risk pregnancy and has S/P cholecystectomy; Gastroesophageal reflux disease without esophagitis; Anemia; History of acute pancreatitis; Bilateral carpal tunnel syndrome; BMI 36.0-36.9,adult; History of loop electrosurgical excision procedure (LEEP) affecting care of mother in first trimester, antepartum; Supervision of high risk pregnancy, antepartum, first trimester; Dizygotic twins; and Dichorionic diamniotic twin pregnancy, antepartum on their problem list.  Patient reports no complaints.   Contractions: Irregular. Vag. Bleeding: None.  Movement: Present. Denies leaking of fluid.   The following portions of the patient's history were reviewed and updated as appropriate: allergies, current medications, past family history, past medical history, past social history, past surgical history and problem list. Problem list updated.  Objective:   Vitals:   11/07/17 1627  BP: 110/70  Weight: 187 lb (84.8 kg)    Fetal Status:     Movement: Present     General:  Alert, oriented and cooperative. Patient is in no acute distress.  Skin: Skin is warm and dry. No rash noted.   Cardiovascular: Normal heart rate noted  Respiratory: Normal respiratory effort, no problems with respiration noted  Abdomen: Soft, gravid, appropriate for gestational age. Pain/Pressure: Present     Pelvic:  Cervical exam deferred        Extremities: Normal range of motion.     Mental Status: Normal mood and affect. Normal behavior. Normal judgment and thought content.   Urinalysis: Urine Protein: Negative    Assessment and Plan:  Pregnancy: G2P1001 at 6924w2d Mercie Eoni Di Twins, desires vag delivery if appropriate Desirers PP BTL or BTL at CS  1. [redacted] weeks gestation of pregnancy - US OB Follow Up;  Future  2. Dichorionic diamniotic twin pregnancy, antepartum - US OB Follow Up; Future  3. Supervision of high risk pregnancy, antepartum, first trimester - US OB Follow Up; Future  Preterm labor symptoms and general obstetric precautions including but not limited to vaginal bleeding, contractions, leaking of fluid and fetal movement were reviewed in detail with the patient. Please refer to After Visit Summary for other counseling recommendations.  Return in about 2 weeks (around 11/21/2017) for HROB w US growth.  Annamarie MajorPaul Eliasar Hlavaty, MD, Merlinda FrederickFACOG [facility], [facility] 11/07/2017  4:46 PM

## 2017-11-07 NOTE — Addendum Note (Signed)
Addended by: Cornelius MorasPATTERSON, Amaad Byers D on: 11/07/2017 05:00 PM   Modules accepted: Orders

## 2017-11-15 ENCOUNTER — Other Ambulatory Visit: Payer: Self-pay | Admitting: Obstetrics & Gynecology

## 2017-11-15 DIAGNOSIS — O30049 Twin pregnancy, dichorionic/diamniotic, unspecified trimester: Secondary | ICD-10-CM

## 2017-11-15 DIAGNOSIS — O0991 Supervision of high risk pregnancy, unspecified, first trimester: Secondary | ICD-10-CM

## 2017-11-15 DIAGNOSIS — Z3A30 30 weeks gestation of pregnancy: Secondary | ICD-10-CM

## 2017-11-22 ENCOUNTER — Ambulatory Visit (INDEPENDENT_AMBULATORY_CARE_PROVIDER_SITE_OTHER): Payer: BC Managed Care – PPO

## 2017-11-22 ENCOUNTER — Encounter: Payer: Self-pay | Admitting: Obstetrics and Gynecology

## 2017-11-22 ENCOUNTER — Ambulatory Visit (INDEPENDENT_AMBULATORY_CARE_PROVIDER_SITE_OTHER): Payer: BC Managed Care – PPO | Admitting: Obstetrics and Gynecology

## 2017-11-22 VITALS — BP 110/66 | Wt 182.5 lb

## 2017-11-22 DIAGNOSIS — O3441 Maternal care for other abnormalities of cervix, first trimester: Secondary | ICD-10-CM

## 2017-11-22 DIAGNOSIS — Z3A31 31 weeks gestation of pregnancy: Secondary | ICD-10-CM | POA: Diagnosis not present

## 2017-11-22 DIAGNOSIS — Z3A32 32 weeks gestation of pregnancy: Secondary | ICD-10-CM

## 2017-11-22 DIAGNOSIS — O30049 Twin pregnancy, dichorionic/diamniotic, unspecified trimester: Secondary | ICD-10-CM

## 2017-11-22 DIAGNOSIS — O30043 Twin pregnancy, dichorionic/diamniotic, third trimester: Secondary | ICD-10-CM

## 2017-11-22 DIAGNOSIS — Z3A30 30 weeks gestation of pregnancy: Secondary | ICD-10-CM

## 2017-11-22 DIAGNOSIS — Z6836 Body mass index (BMI) 36.0-36.9, adult: Secondary | ICD-10-CM

## 2017-11-22 DIAGNOSIS — O0993 Supervision of high risk pregnancy, unspecified, third trimester: Secondary | ICD-10-CM

## 2017-11-22 DIAGNOSIS — O0991 Supervision of high risk pregnancy, unspecified, first trimester: Secondary | ICD-10-CM

## 2017-11-22 NOTE — Progress Notes (Incomplete)
Routine Prenatal Care Visit  Subjective  Kayla Parrish is a 27 y.o. G2P1001 at [redacted]w[redacted]d being seen today for ongoing prenatal care.  She is currently monitored for the following issues for this {Blank single:19197::"high-risk","low-risk"} pregnancy and has S/P cholecystectomy; Gastroesophageal reflux disease without esophagitis; Anemia; History of acute pancreatitis; Bilateral carpal tunnel syndrome; BMI 36.0-36.9,adult; History of loop electrosurgical excision procedure (LEEP) affecting care of mother in first trimester, antepartum; Supervision of high-risk pregnancy, third trimester; Dizygotic twins; and Dichorionic diamniotic twin pregnancy, antepartum on their problem list.  ----------------------------------------------------------------------------------- Patient reports {sx:14538}.   Contractions: Irregular. Vag. Bleeding: None.  Movement: Present. Denies leaking of fluid.  ----------------------------------------------------------------------------------- The following portions of the patient's history were reviewed and updated as appropriate: allergies, current medications, past family history, past medical history, past social history, past surgical history and problem list. Problem list updated.   Objective  Blood pressure 110/66, weight 182 lb 8 oz (82.8 kg), last menstrual period 04/09/2017. Pregravid weight 185 lb (83.9 kg) Total Weight Gain -2 lb 8 oz (-1.134 kg) Urinalysis:      Fetal Status: Fetal Heart Rate (bpm): 144/139   Movement: Present  Presentation: Vertex  General:  Alert, oriented and cooperative. Patient is in no acute distress.  Skin: Skin is warm and dry. No rash noted.   Cardiovascular: Normal heart rate noted  Respiratory: Normal respiratory effort, no problems with respiration noted  Abdomen: Soft, gravid, appropriate for gestational age. Pain/Pressure: Absent     Pelvic:  {Blank single:19197::"Cervical exam performed","Cervical exam deferred"}          Extremities: Normal range of motion.  Edema: None  Mental Status: Normal mood and affect. Normal behavior. Normal judgment and thought content.   Assessment   27 y.o. G2P1001 at [redacted]w[redacted]d by  01/14/2018, by Last Menstrual Period presenting for {Blank single:19197::"routine","work-in"} prenatal visit  Plan   2nd Pregnancy Problems (from 05/17/17 to present)    Problem Noted Resolved   Dichorionic diamniotic twin pregnancy, antepartum 06/25/2017 by Natale Milch, MD No   Overview Signed 06/25/2017 11:51 PM by Natale Milch, MD    [ ]   Growth [place] every 3-4 weeks starting at 24 weeks [ ]  weekly NST/AFI or BPP starting at 36 weeks      Supervision of high-risk pregnancy, third trimester 05/28/2017 by Nadara Mustard, MD No   Overview Addendum 09/11/2017  4:56 PM by Natale Milch, MD      [facility]inic [facility] Prenatal Labs  Dating  T1 US=LMP Blood type: O/Positive/-- (01/08 1511)   Genetic Screen  Declines   Antibody:Negative (01/08 1511)  Anatomic [place]  Rubella: 2.58 (01/08 1511) Varicella: Immune  GTT Early: 140 3hr gtt=normal 28 wk:      RPR: Non Reactive (01/08 1511)   Rhogam  not needed HBsAg: Negative (01/08 1511)   TDaP vaccine                       HIV: Non Reactive (01/08 1511)   Flu Shot   Declines                             GBS:   Contraception  IUD Pap: Due April 2019  CBB   given information   CS/VBAC    Baby Food "going to try pumping"   Support Person             Supervision of normal pregnancy 05/17/2017 by Marcelyn Bruins  [name], CNM 06/25/2017 by Natale MilchSchuman, Christanna R, MD   Overview Signed 05/17/2017 10:02 AM by Oswaldo ConroySchmid, Jacelyn Y, CNM    [facility]inic [facility] Prenatal Labs  Dating  Blood type:     Genetic Screen 1 Screen:    AFP:     Quad:     NIPS: Antibody:   Anatomic US  Rubella:   Varicella:    GTT Early:               Third trimester:  RPR:     Rhogam  HBsAg:     TDaP vaccine                       Flu Shot: HIV:     Baby Food                                 GBS:   Contraception  Pap:  CBB     CS/VBAC    Support Person                  {Blank single:19197::"Term","Preterm"} labor symptoms and general obstetric precautions including but not limited to vaginal bleeding, contractions, leaking of fluid and fetal movement were reviewed in detail with the patient. Please refer to After Visit Summary for other counseling recommendations.   Return in about 2 weeks (around 12/06/2017) for Routine Prenatal Appointment.  Thomasene MohairStephen Wynnie Pacetti, MD, Merlinda FrederickFACOG [facility], [facility] 11/22/2017 12:42 PM

## 2017-11-22 NOTE — Progress Notes (Signed)
Routine Prenatal Care Visit  Subjective  Kayla Parrish N Frieden is a 27 y.o. G2P1001 at 7357w3d being seen today for ongoing prenatal care.  She is currently monitored for the following issues for this high-risk pregnancy and has S/P cholecystectomy; Gastroesophageal reflux disease without esophagitis; Anemia; History of acute pancreatitis; Bilateral carpal tunnel syndrome; BMI 36.0-36.9,adult; History of loop electrosurgical excision procedure (LEEP) affecting care of mother in first trimester, antepartum; Supervision of high-risk pregnancy, third trimester; Dizygotic twins; and Dichorionic diamniotic twin pregnancy, antepartum on their problem list.  ----------------------------------------------------------------------------------- Patient reports no complaints.   Contractions: Irregular. Vag. Bleeding: None.  Movement: Present. Denies leaking of fluid.  Growth concordant:  Twin A 34.3%ile (1,818 grams), Twin B 31.7%ile (1,781 grams) Discordance: 2% Fluid normal.  ----------------------------------------------------------------------------------- The following portions of the patient's history were reviewed and updated as appropriate: allergies, current medications, past family history, past medical history, past social history, past surgical history and problem list. Problem list updated.  Objective  Blood pressure 110/66, weight 182 lb 8 oz (82.8 kg), last menstrual period 04/09/2017. Pregravid weight 185 lb (83.9 kg) Total Weight Gain -2 lb 8 oz (-1.134 kg) Urinalysis:      Fetal Status: Fetal Heart Rate (bpm): 144/139   Movement: Present  Presentation: Vertex/Transverse (head maternal right)  General:  Alert, oriented and cooperative. Patient is in no acute distress.  Skin: Skin is warm and dry. No rash noted.   Cardiovascular: Normal heart rate noted  Respiratory: Normal respiratory effort, no problems with respiration noted  Abdomen: Soft, gravid, appropriate for gestational age.  Pain/Pressure: Absent     Pelvic:  Cervical exam deferred        Extremities: Normal range of motion.  Edema: None  Mental Status: Normal mood and affect. Normal behavior. Normal judgment and thought content.   [place] Ob Follow Up  Result Date: 11/22/2017 ULTRASOUND REPORT Location: [facility] Date of Service: 11/22/2017 Patient Name: Kayla Parrish N Brazell DOB: 12-12-1990 MRN: [id number] Indications: Growth, twin gestation Findings: Dichorionic/diamniotic intrauterine pregnancy is visualized Baby A Biometrics give an (U/S) Gestational age of 6428w0d and an (U/S) EDD of 01/17/2018; this correlates with the clinically established Estimated Date of Delivery: 01/14/18. Fetal presentation is Cephalic. Placenta: Right lateral, Grade 1. MVP: 5.00cm Growth percentile is 34.3%. EFW: 4lb 0oz, 1,818 grams Baby A FHR at 144 BPM. Baby B Biometrics give an (U/S) Gestational age of 2235w5d and an (U/S) EDD of 01/19/2018; this correlates with the clinically established Estimated Date of Delivery: 01/14/18. Fetal presentation is Transverse, head maternal right. Placenta: Anterior, Grade 1. MVP: 4.64cm Growth percentile is 31.7%. EFW: 3lb 15oz, 1,781 grams Baby B FHR at 139 BPM Impression: 1.Viable Dichorionic/Diamniotic Intrauterine pregnancy correlates with previously established criteria. 2. Baby A growth is 34.3 %ile.  MVP is 5.0 cm. 3. Baby B growth is 31.7 %ile. MVP is 4.64 cm. 4. Discordance: 2% Recommendations: 1.Clinical correlation with the patient's History and Physical Exam. Awanda MinkAshley Corbin RDMS, RVT The ultrasound images and findings were reviewed by me and I agree with the above report. Thomasene MohairStephen Camdin Hegner, MD, Merlinda FrederickFACOG [facility], [facility] 11/22/2017 12:35 PM    Assessment   27 y.o. G2P1001 at 6857w3d by  01/14/2018, by Last Menstrual Period presenting for routine prenatal visit  Plan   2nd Pregnancy Problems (from 05/17/17 to present)    Problem Noted Resolved   Dichorionic diamniotic twin pregnancy,  antepartum 06/25/2017 by Natale MilchSchuman, Christanna R, MD No   Overview Signed 06/25/2017 11:51 PM by Natale MilchSchuman, Christanna R, MD    [ ]   Growth [place] every 3-4 weeks starting at 24 weeks [ ]  weekly NST/AFI or BPP starting at 36 weeks      Supervision of high-risk pregnancy, third trimester 05/28/2017 by Nadara Mustard, MD No   Overview Addendum 09/11/2017  4:56 PM by Natale Milch, MD      [facility]inic [facility] Prenatal Labs  Dating  T1 US=LMP Blood type: O/Positive/-- (01/08 1511)   Genetic Screen  Declines   Antibody:Negative (01/08 1511)  Anatomic [place]  Rubella: 2.58 (01/08 1511) Varicella: Immune  GTT Early: 140 3hr gtt=normal 28 wk:      RPR: Non Reactive (01/08 1511)   Rhogam  not needed HBsAg: Negative (01/08 1511)   TDaP vaccine                       HIV: Non Reactive (01/08 1511)   Flu Shot   Declines                             GBS:   Contraception  IUD Pap: Due April 2019  CBB   given information   CS/VBAC    Baby Food "going to try pumping"   Support Person             Supervision of normal pregnancy 05/17/2017 by Oswaldo Conroy, CNM 06/25/2017 by Natale Milch, MD   Overview Signed 05/17/2017 10:02 AM by Oswaldo Conroy, CNM    [facility]inic [facility] Prenatal Labs  Dating  Blood type:     Genetic Screen 1 Screen:    AFP:     Quad:     NIPS: Antibody:   Anatomic [place]  Rubella:   Varicella:    GTT Early:               Third trimester:  RPR:     Rhogam  HBsAg:     TDaP vaccine                       Flu Shot: HIV:     Baby Food                                GBS:   Contraception  Pap:  CBB     CS/VBAC    Support Person                  Preterm labor symptoms and general obstetric precautions including but not limited to vaginal bleeding, contractions, leaking of fluid and fetal movement were reviewed in detail with the patient. Please refer to After Visit Summary for other counseling recommendations.   Discussed u/s results. Patient doing well.  Return in about 2  weeks (around 12/06/2017) for Routine Prenatal Appointment.  Thomasene Mohair, MD, [facility], [facility] 11/22/2017 12:42 PM

## 2017-11-22 NOTE — Progress Notes (Incomplete)
Routine Prenatal Care Visit  Subjective  Kayla Parrish is a 27 y.o. G2P1001 at [redacted]w[redacted]d being seen today for ongoing prenatal care.  She is currently monitored for the following issues for this {Blank single:19197::"high-risk","low-risk"} pregnancy and has S/P cholecystectomy; Gastroesophageal reflux disease without esophagitis; Anemia; History of acute pancreatitis; Bilateral carpal tunnel syndrome; BMI 36.0-36.9,adult; History of loop electrosurgical excision procedure (LEEP) affecting care of mother in first trimester, antepartum; Supervision of high-risk pregnancy, third trimester; Dizygotic twins; and Dichorionic diamniotic twin pregnancy, antepartum on their problem list.  ----------------------------------------------------------------------------------- Patient reports {sx:14538}.   Contractions: Irregular. Vag. Bleeding: None.  Movement: Present. Denies leaking of fluid.  ----------------------------------------------------------------------------------- The following portions of the patient's history were reviewed and updated as appropriate: allergies, current medications, past family history, past medical history, past social history, past surgical history and problem list. Problem list updated.   Objective  Blood pressure 110/66, weight 182 lb 8 oz (82.8 kg), last menstrual period 04/09/2017. Pregravid weight 185 lb (83.9 kg) Total Weight Gain -2 lb 8 oz (-1.134 kg) Urinalysis:      Fetal Status: Fetal Heart Rate (bpm): 144/139   Movement: Present  Presentation: Vertex  General:  Alert, oriented and cooperative. Patient is in no acute distress.  Skin: Skin is warm and dry. No rash noted.   Cardiovascular: Normal heart rate noted  Respiratory: Normal respiratory effort, no problems with respiration noted  Abdomen: Soft, gravid, appropriate for gestational age. Pain/Pressure: Absent     Pelvic:  {Blank single:19197::"Cervical exam performed","Cervical exam deferred"}          Extremities: Normal range of motion.  Edema: None  Mental Status: Normal mood and affect. Normal behavior. Normal judgment and thought content.   Assessment   27 y.o. G2P1001 at [redacted]w[redacted]d by  01/14/2018, by Last Menstrual Period presenting for {Blank single:19197::"routine","work-in"} prenatal visit  Plan   2nd Pregnancy Problems (from 05/17/17 to present)    Problem Noted Resolved   Dichorionic diamniotic twin pregnancy, antepartum 06/25/2017 by Schuman, Christanna R, MD No   Overview Signed 06/25/2017 11:51 PM by Schuman, Christanna R, MD    [ ]  Growth US every 3-4 weeks starting at 24 weeks [ ] weekly NST/AFI or BPP starting at 36 weeks      Supervision of high-risk pregnancy, third trimester 05/28/2017 by Harris, Robert P, MD No   Overview Addendum 09/11/2017  4:56 PM by Schuman, Christanna R, MD      [facility] Prenatal Labs  Dating  T1 US=LMP Blood type: O/Positive/-- (01/08 1511)   Genetic Screen  Declines   Antibody:Negative (01/08 1511)  Anatomic US  Rubella: 2.58 (01/08 1511) Varicella: Immune  GTT Early: 140 3hr gtt=normal 28 wk:      RPR: Non Reactive (01/08 1511)   Rhogam  not needed HBsAg: Negative (01/08 1511)   TDaP vaccine                       HIV: Non Reactive (01/08 1511)   Flu Shot   Declines                             GBS:   Contraception  IUD Pap: Due April 2019  CBB   given information   CS/VBAC    Baby Food "going to try pumping"   Support Person             Supervision of normal pregnancy 05/17/2017 by Schmid, Jacelyn   Y, CNM 06/25/2017 by Schuman, Christanna R, MD   Overview Signed 05/17/2017 10:02 AM by Schmid, Jacelyn Y, CNM    [facility] Prenatal Labs  Dating  Blood type:     Genetic Screen 1 Screen:    AFP:     Quad:     NIPS: Antibody:   Anatomic US  Rubella:   Varicella:    GTT Early:               Third trimester:  RPR:     Rhogam  HBsAg:     TDaP vaccine                       Flu Shot: HIV:     Baby Food                                 GBS:   Contraception  Pap:  CBB     CS/VBAC    Support Person                  {Blank single:19197::"Term","Preterm"} labor symptoms and general obstetric precautions including but not limited to vaginal bleeding, contractions, leaking of fluid and fetal movement were reviewed in detail with the patient. Please refer to After Visit Summary for other counseling recommendations.   Return in about 2 weeks (around 12/06/2017) for Routine Prenatal Appointment.  Vannary Greening, MD, FACOG [facility], [facility] 11/22/2017 12:42 PM   

## 2017-12-06 ENCOUNTER — Ambulatory Visit (INDEPENDENT_AMBULATORY_CARE_PROVIDER_SITE_OTHER): Payer: BC Managed Care – PPO | Admitting: Obstetrics and Gynecology

## 2017-12-06 ENCOUNTER — Encounter: Payer: Self-pay | Admitting: Obstetrics and Gynecology

## 2017-12-06 VITALS — BP 110/64 | Wt 185.0 lb

## 2017-12-06 DIAGNOSIS — O3441 Maternal care for other abnormalities of cervix, first trimester: Secondary | ICD-10-CM

## 2017-12-06 DIAGNOSIS — O0993 Supervision of high risk pregnancy, unspecified, third trimester: Secondary | ICD-10-CM

## 2017-12-06 DIAGNOSIS — Z6836 Body mass index (BMI) 36.0-36.9, adult: Secondary | ICD-10-CM

## 2017-12-06 DIAGNOSIS — O30043 Twin pregnancy, dichorionic/diamniotic, third trimester: Secondary | ICD-10-CM

## 2017-12-06 DIAGNOSIS — O3443 Maternal care for other abnormalities of cervix, third trimester: Secondary | ICD-10-CM

## 2017-12-06 DIAGNOSIS — Z3A34 34 weeks gestation of pregnancy: Secondary | ICD-10-CM

## 2017-12-06 DIAGNOSIS — IMO0002 Reserved for concepts with insufficient information to code with codable children: Secondary | ICD-10-CM

## 2017-12-06 DIAGNOSIS — O30049 Twin pregnancy, dichorionic/diamniotic, unspecified trimester: Secondary | ICD-10-CM

## 2017-12-06 NOTE — Patient Instructions (Signed)
Report to the [facility] Emergency Room registration desk for a Non-Stress Test (NST) of the twins on 8/1 at 12:50 pm (for a 1 o'clock appointment on labor and delivery). Be sure to let the person registering you at the ED registration desk know that you are going to Labor and Delivery.  The NST could take between 20 and 40 minutes.

## 2017-12-06 NOTE — Progress Notes (Incomplete)
Routine Prenatal Care Visit  Subjective  Kayla Parrish is a 27 y.o. G2P1001 at [redacted]w[redacted]d being seen today for ongoing prenatal care.  She is currently monitored for the following issues for this {Blank single:19197::"high-risk","low-risk"} pregnancy and has S/P cholecystectomy; Gastroesophageal reflux disease without esophagitis; Anemia; History of acute pancreatitis; Bilateral carpal tunnel syndrome; BMI 36.0-36.9,adult; History of loop electrosurgical excision procedure (LEEP) affecting care of mother in first trimester, antepartum; Supervision of high-risk pregnancy, third trimester; Dizygotic twins; and Dichorionic diamniotic twin pregnancy, antepartum on their problem list.  ----------------------------------------------------------------------------------- Patient reports {sx:14538}.   Contractions: Irregular. Vag. Bleeding: None.  Movement: Present. Denies leaking of fluid.  ----------------------------------------------------------------------------------- The following portions of the patient's history were reviewed and updated as appropriate: allergies, current medications, past family history, past medical history, past social history, past surgical history and problem list. Problem list updated.   Objective  Blood pressure 110/64, weight 185 lb (83.9 kg), last menstrual period 04/09/2017. Pregravid weight 185 lb (83.9 kg) Total Weight Gain 0 lb (0 kg) Urinalysis: Urine Protein: Trace Urine Glucose: Negative  Fetal Status: Fetal Heart Rate (bpm): 135/135   Movement: Present  Presentation: Vertex  General:  Alert, oriented and cooperative. Patient is in no acute distress.  Skin: Skin is warm and dry. No rash noted.   Cardiovascular: Normal heart rate noted  Respiratory: Normal respiratory effort, no problems with respiration noted  Abdomen: Soft, gravid, appropriate for gestational age. Pain/Pressure: Absent     Pelvic:  {Blank single:19197::"Cervical exam performed","Cervical exam  deferred"}        Extremities: Normal range of motion.     Mental Status: Normal mood and affect. Normal behavior. Normal judgment and thought content.   Assessment   27 y.o. G2P1001 at [redacted]w[redacted]d by  01/14/2018, by Last Menstrual Period presenting for {Blank single:19197::"routine","work-in"} prenatal visit  Plan   2nd Pregnancy Problems (from 05/17/17 to present)    Problem Noted Resolved   Dichorionic diamniotic twin pregnancy, antepartum 06/25/2017 by Natale Milch, MD No   Overview Signed 06/25/2017 11:51 PM by Natale Milch, MD    [ ]   Growth [place] every 3-4 weeks starting at 24 weeks [ ]  weekly NST/AFI or BPP starting at 36 weeks      Supervision of high-risk pregnancy, third trimester 05/28/2017 by Nadara Mustard, MD No   Overview Addendum 09/11/2017  4:56 PM by Natale Milch, MD      [facility] Prenatal Labs  Dating  T1 US=LMP Blood type: O/Positive/-- (01/08 1511)   Genetic Screen  Declines   Antibody:Negative (01/08 1511)  Anatomic [place]  Rubella: 2.58 (01/08 1511) Varicella: Immune  GTT Early: 140 3hr gtt=normal 28 wk:      RPR: Non Reactive (01/08 1511)   Rhogam  not needed HBsAg: Negative (01/08 1511)   TDaP vaccine                       HIV: Non Reactive (01/08 1511)   Flu Shot   Declines                             GBS:   Contraception  IUD Pap: Due April 2019  CBB   given information   CS/VBAC    Baby Food "going to try pumping"   Support Person             Supervision of normal pregnancy 05/17/2017 by Oswaldo Conroy, CNM 06/25/2017  by Natale MilchSchuman, Christanna R, MD   Overview Signed 05/17/2017 10:02 AM by Oswaldo ConroySchmid, Jacelyn Y, CNM    [facility] Prenatal Labs  Dating  Blood type:     Genetic Screen 1 Screen:    AFP:     Quad:     NIPS: Antibody:   Anatomic US  Rubella:   Varicella:    GTT Early:               Third trimester:  RPR:     Rhogam  HBsAg:     TDaP vaccine                       Flu Shot: HIV:     Baby Food                                 GBS:   Contraception  Pap:  CBB     CS/VBAC    Support Person                  {Blank single:19197::"Term","Preterm"} labor symptoms and general obstetric precautions including but not limited to vaginal bleeding, contractions, leaking of fluid and fetal movement were reviewed in detail with the patient. Please refer to After Visit Summary for other counseling recommendations.   Return in about 2 weeks (around 12/20/2017) for schedule u/s for growth of TWINS and routine prenatal after.  Thomasene MohairStephen Cherry Turlington, MD, Merlinda FrederickFACOG [facility], [facility] 12/06/2017 9:50 AM

## 2017-12-06 NOTE — Progress Notes (Signed)
Routine Prenatal Care Visit  Subjective  Kayla Parrish is a 27 y.o. G2P1001 at 6219w3d being seen today for ongoing prenatal care.  She is currently monitored for the following issues for this high-risk pregnancy and has S/P cholecystectomy; Gastroesophageal reflux disease without esophagitis; Anemia; History of acute pancreatitis; Bilateral carpal tunnel syndrome; BMI 36.0-36.9,adult; History of loop electrosurgical excision procedure (LEEP) affecting care of mother in first trimester, antepartum; Supervision of high-risk pregnancy, third trimester; Dizygotic twins; and Dichorionic diamniotic twin pregnancy, antepartum on their problem list.  ----------------------------------------------------------------------------------- Patient reports no complaints.   Contractions: Irregular. Vag. Bleeding: None.  Movement: Present. Denies leaking of fluid.  ----------------------------------------------------------------------------------- The following portions of the patient's history were reviewed and updated as appropriate: allergies, current medications, past family history, past medical history, past social history, past surgical history and problem list. Problem list updated.   Objective  Blood pressure 110/64, weight 185 lb (83.9 kg), last menstrual period 04/09/2017. Pregravid weight 185 lb (83.9 kg) Total Weight Gain 0 lb (0 kg) Urinalysis: Urine Protein: Trace Urine Glucose: Negative  Fetal Status: Fetal Heart Rate (bpm): 135/135   Movement: Present x2  Presentation: Vertex(A) - spine maternal left/Transverse (B) spine anterior and head maternal right.    General:  Alert, oriented and cooperative. Patient is in no acute distress.  Skin: Skin is warm and dry. No rash noted.   Cardiovascular: Normal heart rate noted  Respiratory: Normal respiratory effort, no problems with respiration noted  Abdomen: Soft, gravid, appropriate for gestational age. Pain/Pressure: Absent     Pelvic:  Cervical  exam deferred        Extremities: Normal range of motion.     Mental Status: Normal mood and affect. Normal behavior. Normal judgment and thought content.   Assessment   27 y.o. G2P1001 at 2819w3d by  01/14/2018, by Last Menstrual Period presenting for routine prenatal visit  Plan   2nd Pregnancy Problems (from 05/17/17 to present)    Problem Noted Resolved   Dichorionic diamniotic twin pregnancy, antepartum 06/25/2017 by Natale MilchSchuman, Christanna R, MD No   Overview Signed 06/25/2017 11:51 PM by Natale MilchSchuman, Christanna R, MD    [ ]   Growth US every 3-4 weeks starting at 24 weeks [ ]  weekly NST/AFI or BPP starting at 36 weeks      Supervision of high-risk pregnancy, third trimester 05/28/2017 by Nadara MustardHarris, Robert P, MD No   Overview Addendum 09/11/2017  4:56 PM by Natale MilchSchuman, Christanna R, MD      [facility] Prenatal Labs  Dating  T1 US=LMP Blood type: O/Positive/-- (01/08 1511)   Genetic Screen  Declines   Antibody:Negative (01/08 1511)  Anatomic US  Rubella: 2.58 (01/08 1511) Varicella: Immune  GTT Early: 140 3hr gtt=normal 28 wk:      RPR: Non Reactive (01/08 1511)   Rhogam  not needed HBsAg: Negative (01/08 1511)   TDaP vaccine                       HIV: Non Reactive (01/08 1511)   Flu Shot   Declines                             GBS:   Contraception  IUD Pap: Due April 2019  CBB   given information   CS/VBAC    Baby Food "going to try pumping"   Support Person             Supervision  of normal pregnancy 05/17/2017 by Oswaldo Conroy, CNM 06/25/2017 by Natale Milch, MD   Overview Signed 05/17/2017 10:02 AM by Oswaldo Conroy, CNM    [facility] Prenatal Labs  Dating  Blood type:     Genetic Screen 1 Screen:    AFP:     Quad:     NIPS: Antibody:   Anatomic [place]  Rubella:   Varicella:    GTT Early:               Third trimester:  RPR:     Rhogam  HBsAg:     TDaP vaccine                       Flu Shot: HIV:     Baby Food                                GBS:   Contraception   Pap:  CBB     CS/VBAC    Support Person                  Preterm labor symptoms and general obstetric precautions including but not limited to vaginal bleeding, contractions, leaking of fluid and fetal movement were reviewed in detail with the patient. Please refer to After Visit Summary for other counseling recommendations.   -NST scheduled for 8/1 at 1PM on L&D.  Patient aware and instructions placed in AVS.   Return in about 2 weeks (around 12/20/2017) for schedule u/s for growth of TWINS and routine prenatal after.  Thomasene Mohair, MD, [facility], [facility] 12/06/2017 9:50 AM

## 2017-12-19 ENCOUNTER — Observation Stay
Admission: EM | Admit: 2017-12-19 | Discharge: 2017-12-19 | Disposition: A | Discharge status: Home / Self Care | Payer: BC Managed Care – PPO | Attending: Obstetrics & Gynecology | Admitting: Obstetrics & Gynecology

## 2017-12-19 DIAGNOSIS — IMO0002 Reserved for concepts with insufficient information to code with codable children: Secondary | ICD-10-CM | POA: Diagnosis present

## 2017-12-19 DIAGNOSIS — Z3A36 36 weeks gestation of pregnancy: Secondary | ICD-10-CM | POA: Insufficient documentation

## 2017-12-19 DIAGNOSIS — O30043 Twin pregnancy, dichorionic/diamniotic, third trimester: Secondary | ICD-10-CM | POA: Diagnosis not present

## 2017-12-19 DIAGNOSIS — Z885 Allergy status to narcotic agent status: Secondary | ICD-10-CM | POA: Diagnosis not present

## 2017-12-19 MED ORDER — ACETAMINOPHEN 325 MG PO TABS: 650.0000 mg | ORAL_TABLET | ORAL | Status: DC | PRN

## 2017-12-19 MED ORDER — ONDANSETRON HCL 4 MG/2ML IJ SOLN: 4.0000 mg | Freq: Four times a day (QID) | INTRAMUSCULAR | Status: DC | PRN

## 2017-12-19 NOTE — Discharge Summary (Addendum)
  See FPN 

## 2017-12-19 NOTE — Final Progress Note (Signed)
Physician Final Progress Note  Patient ID: Kayla Parrish MRN: [id number] DOB/AGE: [birth year] 27 y.o.  Admit date: 12/19/2017 Admitting provider: Nadara Mustardobert P Rainer Mounce, MD Discharge date: 12/19/2017  Admission Diagnoses: Twins, 36 weeks  Discharge Diagnoses:  Same  Consults: None  Significant Findings/ Diagnostic Studies:   HPI:  27 y.o. G2P1001 @ 3845w2d (01/14/2018, by Last Menstrual Period). Admitted on 12/19/2017:   Patient Active Problem List   Diagnosis Date Noted  . Dichorionic diamniotic twin pregnancy, antepartum 06/25/2017  . Supervision of high-risk pregnancy, third trimester 05/28/2017  . Dizygotic twins 05/28/2017  . BMI 36.0-36.9,adult 05/17/2017  . History of loop electrosurgical excision procedure (LEEP) affecting care of mother in first trimester, antepartum 05/17/2017  . Anemia 06/13/2016  . History of acute pancreatitis 06/13/2016  . Bilateral carpal tunnel syndrome 06/13/2016  . Gastroesophageal reflux disease without esophagitis 05/12/2015  . S/P cholecystectomy 01/16/2015    Presents for NST due to twins at 36 weeks fro antepartum surveillance. Reports good FM.  Occas B-H ctxs.  No VB or ROM.Marland Kitchen.   [facility]. Pregnancy complicated by multiple gestation.  ROS: A review of systems was performed and negative, except as stated in the above HPI. PMHx:  Past Medical History:  Diagnosis Date  . Acute gallstone pancreatitis   . Cholelithiases 01/16/2015  . Pancreatitis, acute 01/16/2015   PSHx:  Past Surgical History:  Procedure Laterality Date  . CHOLECYSTECTOMY N/A 01/19/2015   Procedure: LAPAROSCOPIC CHOLECYSTECTOMY WITH INTRAOPERATIVE CHOLANGIOGRAM;  Surgeon: Ida Roguehristopher Lundquist, MD;  Location: [facility] ORS;  Service: General;  Laterality: N/A;   Medications:  Medications Prior to Admission  Medication Sig Dispense Refill Last Dose  . ondansetron (ZOFRAN ODT) 4 MG disintegrating tablet Take 1 tablet (4 mg total) by mouth every 6 (six) hours as  needed for nausea. 25 tablet 3 Taking  . Prenatal Vit-Fe Fumarate-FA (MULTIVITAMIN-PRENATAL) 27-0.8 MG TABS tablet Take 1 tablet by mouth daily at 12 noon.   Taking   Allergies: is allergic to tramadol. OBHx:  OB History  Gravida Para Term Preterm AB Living  2 1 1     1   SAB TAB Ectopic Multiple Live Births          1    # Outcome Date GA Lbr Len/2nd Weight Sex Delivery Anes PTL Lv  2 Current           1 Term 07/12/14 7357w1d  7 lb 2 oz (3.232 kg) F Vag-Spont   LIV   QMV:HQIONGEX/[id number]:Negative/unremarkable except as detailed in HPI.Marland Kitchen.  No family history of birth defects. Soc Hx: No tob, drug, EtOH  Objective:  There were no vitals filed for this visit. Constitutional: Well nourished, well developed female in no acute distress.  HEENT: normal Skin: Warm and dry.  Cardiovascular:Regular rate and rhythm.   Extremity: trace to 1+ bilateral pedal edema Respiratory: Clear to auscultation bilateral. Normal respiratory effort Abdomen: gravid, NT, FHT 140s Back: no CVAT Neuro: DTRs 2+, Cranial nerves grossly intact Psych: Alert and Oriented x3. No memory deficits. Normal mood and affect.  MS: normal gait, normal bilateral lower extremity ROM/strength/stability.  Perinatal info:  Blood type: O positive Rubella- Immune Varicella -Immune TDaP tetanus status unknown to the patient RPR NR / HIV Neg/ HBsAg Neg   Procedures: A NST procedure was performed with FHR monitoring and a normal baseline established, appropriate time of 20-40 minutes of evaluation, and accels >2 seen w 15x15 characteristics.  Results show a REACTIVE NST.   Discharge Condition: good  Disposition: Discharge disposition: 01-Home or Self Care     Appt tomorrow with [place] [facility]  Diet: Regular diet  Discharge Activity: Activity as tolerated  Discharge Instructions    Call MD for:   Complete by:  As directed    Worsening contractions or pain; leakage of fluid; bleeding.   Diet general   Complete by:  As directed     Increase activity slowly   Complete by:  As directed      Allergies as of 12/19/2017      Reactions   Tramadol Anaphylaxis, Rash      Medication List    TAKE these medications   multivitamin-prenatal 27-0.8 MG Tabs tablet Take 1 tablet by mouth daily at 12 noon.   ondansetron 4 MG disintegrating tablet Commonly known as:  ZOFRAN ODT Take 1 tablet (4 mg total) by mouth every 6 (six) hours as needed for nausea.        Total time spent taking care of this patient: 15 minutes  Signed: Letitia Libra 12/19/2017, 2:34 PM

## 2017-12-19 NOTE — Discharge Instructions (Signed)
Call your provider for any other concerns. °

## 2017-12-20 ENCOUNTER — Other Ambulatory Visit: Payer: Self-pay | Admitting: Obstetrics and Gynecology

## 2017-12-20 ENCOUNTER — Encounter (INDEPENDENT_AMBULATORY_CARE_PROVIDER_SITE_OTHER): Payer: Self-pay

## 2017-12-20 ENCOUNTER — Ambulatory Visit (INDEPENDENT_AMBULATORY_CARE_PROVIDER_SITE_OTHER): Payer: BC Managed Care – PPO

## 2017-12-20 ENCOUNTER — Ambulatory Visit (INDEPENDENT_AMBULATORY_CARE_PROVIDER_SITE_OTHER): Payer: BC Managed Care – PPO | Admitting: Obstetrics and Gynecology

## 2017-12-20 VITALS — BP 130/80 | Wt 190.0 lb

## 2017-12-20 DIAGNOSIS — O0993 Supervision of high risk pregnancy, unspecified, third trimester: Secondary | ICD-10-CM

## 2017-12-20 DIAGNOSIS — O30043 Twin pregnancy, dichorionic/diamniotic, third trimester: Secondary | ICD-10-CM

## 2017-12-20 DIAGNOSIS — O30049 Twin pregnancy, dichorionic/diamniotic, unspecified trimester: Secondary | ICD-10-CM

## 2017-12-20 DIAGNOSIS — Z3A35 35 weeks gestation of pregnancy: Secondary | ICD-10-CM | POA: Diagnosis not present

## 2017-12-20 DIAGNOSIS — Z3A36 36 weeks gestation of pregnancy: Secondary | ICD-10-CM

## 2017-12-20 DIAGNOSIS — Z3685 Encounter for antenatal screening for Streptococcus B: Secondary | ICD-10-CM

## 2017-12-20 NOTE — Progress Notes (Signed)
ROB Growth scan GBS 

## 2017-12-20 NOTE — Progress Notes (Signed)
Routine Prenatal Care Visit  Subjective  Kayla Parrish is a 27 y.o. G2P1001 at 412w3d being seen today for ongoing prenatal care.  She is currently monitored for the following issues for this high-risk pregnancy and has S/P cholecystectomy; Gastroesophageal reflux disease without esophagitis; Anemia; History of acute pancreatitis; Bilateral carpal tunnel syndrome; BMI 36.0-36.9,adult; History of loop electrosurgical excision procedure (LEEP) affecting care of mother in first trimester, antepartum; Supervision of high-risk pregnancy, third trimester; and Dichorionic diamniotic twin pregnancy, antepartum on their problem list.  ----------------------------------------------------------------------------------- Patient reports occasional contractions.   Contractions: Irregular. Vag. Bleeding: None.  Movement: Present. Denies leaking of fluid.  ----------------------------------------------------------------------------------- The following portions of the patient's history were reviewed and updated as appropriate: allergies, current medications, past family history, past medical history, past social history, past surgical history and problem list. Problem list updated.   Objective  Blood pressure 130/80, weight 190 lb (86.2 kg), last menstrual period 04/09/2017. Pregravid weight 185 lb (83.9 kg) Total Weight Gain 5 lb (2.268 kg) Urinalysis:      Fetal Status: Fetal Heart Rate (bpm): 125   Movement: Present  Presentation: Vertex  General:  Alert, oriented and cooperative. Patient is in no acute distress.  Skin: Skin is warm and dry. No rash noted.   Cardiovascular: Normal heart rate noted  Respiratory: Normal respiratory effort, no problems with respiration noted  Abdomen: Soft, gravid, appropriate for gestational age. Pain/Pressure: Present     Pelvic:  Cervical exam performed Dilation: 1 Effacement (%): 50 Station: -3  Extremities: Normal range of motion.     ental Status: Normal mood  and affect. Normal behavior. Normal judgment and thought content.   [place] Ob Follow Up  Result Date: 12/20/2017 ULTRASOUND REPORT Location: [facility] Date of Service: 12/20/2017 Indications:growth/afi for Di/Di Twins Findings: Di/Di Twin intrauterine pregnancy is visualized  Baby A: Lower/ Left FHR at 142 BPM. Biometrics give an (U/S) Gestational age of 6958w2d and an (U/S) EDD of 01/22/2018; this correlates with the clinically established Estimated Date of Delivery: 01/14/18. Fetal presentation is cephalic. Placenta: Anterior, grade 3. MVP: 3.07cm  Growth percentile is 31.4%. EFW: 5lbs 15oz (2704 grams)  Baby B: Upper/ Right FHR at 125 BPM. Biometrics give an (U/S) Gestational age of 2054w3d and an (U/S) EDD of 01/21/2018; this correlates with the clinically established Estimated Date of Delivery: 01/14/18. Fetal presentation is transverse, head right Placenta: Posterior, grade 3. MVP: 4.41 cm  Growth percentile is  24.7th. EFW: 5lbs 12oz (2609 grams)  Impression: 1. 4632w2d Viable Di/Di Twin Intrauterine pregnancy previously established criteria. 2. Baby A: Growth is 31.4th %ile.  MVP is 3.07 cm. 3. Baby B: Growth is 24.7th %ile. MVP is 4.41 cm 4. Growth discordance is 4 %  Recommendations: 1.Clinical correlation with the patient's History and Physical Exam. Liane ComberMital Patel, RDMS There is a di/di twin gestation with normal amniotic fluid volume. The fetal biometries correlate with established dating with concordant growth.  Limited fetal anatomy was performed.The visualized fetal anatomical survey appears within normal limits within the resolution of ultrasound as described above.  It must be noted that a normal ultrasound is unable to rule out fetal aneuploidy.  Vena AustriaAndreas Eusebio Blazejewski, MD, Merlinda FrederickFACOG [facility], [facility] 12/20/2017, 10:56 AM   [place] Ob Follow Up  Result Date: 11/22/2017 ULTRASOUND REPORT Location: [facility] Date of Service: 11/22/2017 Patient Name: Kayla Parrish DOB: 1991/04/01 MRN:  [id number] Indications: Growth, twin gestation Findings: Dichorionic/diamniotic intrauterine pregnancy is visualized Baby A Biometrics give an (U/S)  Gestational age of [redacted]w[redacted]d and an (U/S) EDD of 01/17/2018; this correlates with the clinically established Estimated Date of Delivery: 01/14/18. Fetal presentation is Cephalic. Placenta: Right lateral, Grade 1. MVP: 5.00cm Growth percentile is 34.3%. EFW: 4lb 0oz, 1,818 grams Baby A FHR at 144 BPM. Baby B Biometrics give an (U/S) Gestational age of [redacted]w[redacted]d and an (U/S) EDD of 01/19/2018; this correlates with the clinically established Estimated Date of Delivery: 01/14/18. Fetal presentation is Transverse, head maternal right. Placenta: Anterior, Grade 1. MVP: 4.64cm Growth percentile is 31.7%. EFW: 3lb 15oz, 1,781 grams Baby B FHR at 139 BPM Impression: 1.Viable Dichorionic/Diamniotic Intrauterine pregnancy correlates with previously established criteria. 2. Baby A growth is 34.3 %ile.  MVP is 5.0 cm. 3. Baby B growth is 31.7 %ile. MVP is 4.64 cm. 4. Discordance: 2% Recommendations: 1.Clinical correlation with the patient's History and Physical Exam. Awanda Mink RDMS, RVT The ultrasound images and findings were reviewed by me and I agree with the above report. Thomasene Mohair, MD, [facility], [facility] 11/22/2017 12:35 PM      Assessment   27 y.o. G2P1001 at [redacted]w[redacted]d by  01/14/2018, by Last Menstrual Period presenting for routine prenatal visit  Plan    2nd Pregnancy Problems (from 05/17/17 to present)    Problem Noted Resolved   Dichorionic diamniotic twin pregnancy, antepartum 06/25/2017 by Natale Milch, MD No   Overview Addendum 12/20/2017 11:37 AM by Vena Austria, MD    [X]   Growth [place] every 3-4 weeks starting at 24 weeks [X]  weekly NST/AFI or BPP starting at 36 weeks  36 week growth cephalic/transverse A 5lbs 15oz c/w 31.4% B 5lbs 12oz c/w 24.7%      Supervision of high-risk pregnancy, third trimester 05/28/2017 by  Nadara Mustard, MD No   Overview Addendum 12/20/2017 11:40 AM by Vena Austria, MD      Clinic [facility] Prenatal Labs  Dating  T1 US=LMP Blood type: O/Positive/-- (01/08 1511)   Genetic Screen  Declines   Antibody:Negative (01/08 1511)  Anatomic [place] Normal female/female Rubella: 2.58 (01/08 1511) Varicella: Immune  GTT Early: 140 3hr7 75, 102, 110, 107 28 wk: 104 RPR: Non Reactive (01/08 1511)   Rhogam  not needed HBsAg: Negative (01/08 1511)   TDaP vaccine 11/07/2017          HIV: Non Reactive (01/08 1511)   Flu Shot Declines                             GBS:   Contraception IUD Pap: August 24, 2017 NIL  CBB   given information Pelvis tested to 7lbs 2oz  CS/Vaginal Vaginal   Baby Food "going to try pumping"   Support Person  Husband Molli Hazard              Gestational age appropriate obstetric precautions including but not limited to vaginal bleeding, contractions, leaking of fluid and fetal movement were reviewed in detail with the patient.   - discussed mode of delivery vaginal  Return in about 1 week (around 12/27/2017) for ROB and AFI.  Vena Austria, MD, Evern Core [facility], [facility] 12/20/2017, 11:35 AM

## 2017-12-22 LAB — STREP GP B NAA: STREP GROUP B AG: POSITIVE — AB

## 2017-12-26 ENCOUNTER — Inpatient Hospital Stay: Payer: BC Managed Care – PPO | Admitting: Anesthesiology

## 2017-12-26 ENCOUNTER — Ambulatory Visit (INDEPENDENT_AMBULATORY_CARE_PROVIDER_SITE_OTHER): Payer: BC Managed Care – PPO

## 2017-12-26 ENCOUNTER — Inpatient Hospital Stay
Admission: EM | Admit: 2017-12-26 | Discharge: 2017-12-29 | DRG: 797 | Disposition: A | Discharge status: Home / Self Care | Payer: BC Managed Care – PPO | Attending: Obstetrics and Gynecology | Admitting: Obstetrics and Gynecology

## 2017-12-26 ENCOUNTER — Other Ambulatory Visit: Payer: Self-pay

## 2017-12-26 ENCOUNTER — Other Ambulatory Visit: Payer: Self-pay | Admitting: Obstetrics and Gynecology

## 2017-12-26 ENCOUNTER — Encounter: Payer: Self-pay | Admitting: *Deleted

## 2017-12-26 ENCOUNTER — Ambulatory Visit (INDEPENDENT_AMBULATORY_CARE_PROVIDER_SITE_OTHER): Payer: BC Managed Care – PPO | Admitting: Obstetrics and Gynecology

## 2017-12-26 VITALS — BP 140/80 | Wt 193.0 lb

## 2017-12-26 DIAGNOSIS — O1404 Mild to moderate pre-eclampsia, complicating childbirth: Secondary | ICD-10-CM | POA: Diagnosis present

## 2017-12-26 DIAGNOSIS — O99824 Streptococcus B carrier state complicating childbirth: Secondary | ICD-10-CM | POA: Diagnosis present

## 2017-12-26 DIAGNOSIS — O99214 Obesity complicating childbirth: Secondary | ICD-10-CM | POA: Diagnosis present

## 2017-12-26 DIAGNOSIS — O0993 Supervision of high risk pregnancy, unspecified, third trimester: Secondary | ICD-10-CM

## 2017-12-26 DIAGNOSIS — Z3A37 37 weeks gestation of pregnancy: Secondary | ICD-10-CM

## 2017-12-26 DIAGNOSIS — O30043 Twin pregnancy, dichorionic/diamniotic, third trimester: Secondary | ICD-10-CM

## 2017-12-26 DIAGNOSIS — R51 Headache: Secondary | ICD-10-CM

## 2017-12-26 DIAGNOSIS — Z6836 Body mass index (BMI) 36.0-36.9, adult: Secondary | ICD-10-CM

## 2017-12-26 DIAGNOSIS — O30031 Twin pregnancy, monochorionic/diamniotic, first trimester: Secondary | ICD-10-CM

## 2017-12-26 DIAGNOSIS — O163 Unspecified maternal hypertension, third trimester: Secondary | ICD-10-CM | POA: Diagnosis present

## 2017-12-26 DIAGNOSIS — O30049 Twin pregnancy, dichorionic/diamniotic, unspecified trimester: Secondary | ICD-10-CM

## 2017-12-26 DIAGNOSIS — O9902 Anemia complicating childbirth: Secondary | ICD-10-CM | POA: Diagnosis not present

## 2017-12-26 DIAGNOSIS — E669 Obesity, unspecified: Secondary | ICD-10-CM | POA: Diagnosis present

## 2017-12-26 DIAGNOSIS — D62 Acute posthemorrhagic anemia: Secondary | ICD-10-CM | POA: Diagnosis not present

## 2017-12-26 DIAGNOSIS — O9081 Anemia of the puerperium: Secondary | ICD-10-CM | POA: Diagnosis not present

## 2017-12-26 DIAGNOSIS — O321XX2 Maternal care for breech presentation, fetus 2: Secondary | ICD-10-CM | POA: Diagnosis present

## 2017-12-26 DIAGNOSIS — R030 Elevated blood-pressure reading, without diagnosis of hypertension: Secondary | ICD-10-CM

## 2017-12-26 DIAGNOSIS — Z302 Encounter for sterilization: Secondary | ICD-10-CM

## 2017-12-26 DIAGNOSIS — Z3A36 36 weeks gestation of pregnancy: Secondary | ICD-10-CM

## 2017-12-26 DIAGNOSIS — D509 Iron deficiency anemia, unspecified: Secondary | ICD-10-CM | POA: Diagnosis present

## 2017-12-26 DIAGNOSIS — D508 Other iron deficiency anemias: Secondary | ICD-10-CM

## 2017-12-26 DIAGNOSIS — D649 Anemia, unspecified: Secondary | ICD-10-CM | POA: Diagnosis present

## 2017-12-26 DIAGNOSIS — O9989 Other specified diseases and conditions complicating pregnancy, childbirth and the puerperium: Secondary | ICD-10-CM

## 2017-12-26 LAB — COMPREHENSIVE METABOLIC PANEL
ALBUMIN: 2.7 g/dL — AB (ref 3.5–5.0)
ALT: 7 U/L (ref 0–44)
AST: 22 U/L (ref 15–41)
Alkaline Phosphatase: 299 U/L — ABNORMAL HIGH (ref 38–126)
Anion gap: 7 (ref 5–15)
CHLORIDE: 109 mmol/L (ref 98–111)
CO2: 21 mmol/L — AB (ref 22–32)
CREATININE: 0.58 mg/dL (ref 0.44–1.00)
Calcium: 8.4 mg/dL — ABNORMAL LOW (ref 8.9–10.3)
GFR calc Af Amer: >60 mL/min (ref >60)
GLUCOSE: 78 mg/dL (ref 70–99)
POTASSIUM: 3.6 mmol/L (ref 3.5–5.1)
Sodium: 137 mmol/L (ref 135–145)
Total Bilirubin: 1.1 mg/dL (ref 0.3–1.2)
Total Protein: 7 g/dL (ref 6.5–8.1)

## 2017-12-26 LAB — CBC
HCT: 26.4 % — ABNORMAL LOW (ref 35.0–47.0)
Hemoglobin: 9 g/dL — ABNORMAL LOW (ref 12.0–16.0)
MCH: 26 pg (ref 26.0–34.0)
MCHC: 34.1 g/dL (ref 32.0–36.0)
MCV: 76.3 fL — ABNORMAL LOW (ref 80.0–100.0)
PLATELETS: 236 10*3/uL (ref 150–440)
RBC: 3.45 MIL/uL — AB (ref 3.80–5.20)
RDW: 15.5 % — ABNORMAL HIGH (ref 11.5–14.5)
WBC: 9.5 10*3/uL (ref 3.6–11.0)

## 2017-12-26 LAB — TYPE AND SCREEN
ABO/RH(D): O POS
ANTIBODY SCREEN: NEGATIVE

## 2017-12-26 LAB — POCT URINALYSIS DIPSTICK OB: GLUCOSE, UA: NEGATIVE — AB

## 2017-12-26 LAB — PROTEIN / CREATININE RATIO, URINE
Creatinine, Urine: 77 mg/dL
PROTEIN CREATININE RATIO: 0.78 mg/mg{creat} — AB (ref 0.00–0.15)
TOTAL PROTEIN, URINE: 60 mg/dL

## 2017-12-26 MED ORDER — EPHEDRINE 5 MG/ML INJ
10.0000 mg | INTRAVENOUS | Status: DC | PRN
  Filled 2017-12-26: qty 2

## 2017-12-26 MED ORDER — ONDANSETRON HCL 4 MG/2ML IJ SOLN
4.0000 mg | Freq: Four times a day (QID) | INTRAMUSCULAR | Status: DC | PRN
  Administered 2017-12-27: 4 mg via INTRAVENOUS
  Filled 2017-12-26: qty 2

## 2017-12-26 MED ORDER — SODIUM CHLORIDE 0.9 % IV SOLN
1.0000 g | INTRAVENOUS | Status: DC
  Administered 2017-12-27: 1 g via INTRAVENOUS
  Filled 2017-12-26 (×5): qty 1000

## 2017-12-26 MED ORDER — LACTATED RINGERS IV SOLN: 500.0000 mL | INTRAVENOUS | Status: DC | PRN

## 2017-12-26 MED ORDER — LACTATED RINGERS IV SOLN
INTRAVENOUS | Status: DC
  Administered 2017-12-26: 1000 mL via INTRAVENOUS
  Administered 2017-12-26: 23:00:00 via INTRAVENOUS

## 2017-12-26 MED ORDER — OXYTOCIN 40 UNITS IN LACTATED RINGERS INFUSION - SIMPLE MED
2.5000 [IU]/h | INTRAVENOUS | Status: DC
  Filled 2017-12-26: qty 1000

## 2017-12-26 MED ORDER — OXYTOCIN 10 UNIT/ML IJ SOLN
INTRAMUSCULAR | Status: AC
  Filled 2017-12-26: qty 2

## 2017-12-26 MED ORDER — FENTANYL 2.5 MCG/ML W/ROPIVACAINE 0.15% IN NS 100 ML EPIDURAL (ARMC)
EPIDURAL | Status: AC
  Administered 2017-12-27: 12 mL/h via EPIDURAL
  Filled 2017-12-26: qty 100

## 2017-12-26 MED ORDER — AMMONIA AROMATIC IN INHA
RESPIRATORY_TRACT | Status: AC
Start: 2017-12-26 — End: 2017-12-27
  Filled 2017-12-26: qty 10

## 2017-12-26 MED ORDER — PHENYLEPHRINE 40 MCG/ML (10ML) SYRINGE FOR IV PUSH (FOR BLOOD PRESSURE SUPPORT)
80.0000 ug | PREFILLED_SYRINGE | INTRAVENOUS | Status: DC | PRN
  Filled 2017-12-26: qty 5

## 2017-12-26 MED ORDER — LIDOCAINE HCL (PF) 1 % IJ SOLN
INTRAMUSCULAR | Status: AC
  Filled 2017-12-26: qty 30

## 2017-12-26 MED ORDER — OXYTOCIN 10 UNIT/ML IJ SOLN: 10.0000 [IU] | Freq: Once | INTRAMUSCULAR | Status: DC

## 2017-12-26 MED ORDER — FENTANYL 2.5 MCG/ML W/ROPIVACAINE 0.15% IN NS 100 ML EPIDURAL (ARMC)
12.0000 mL/h | EPIDURAL | Status: DC
  Administered 2017-12-27: 12 mL/h via EPIDURAL
  Filled 2017-12-26: qty 100

## 2017-12-26 MED ORDER — MISOPROSTOL 200 MCG PO TABS
ORAL_TABLET | ORAL | Status: AC
  Administered 2017-12-27: 800 ug
  Filled 2017-12-26: qty 4

## 2017-12-26 MED ORDER — LACTATED RINGERS IV SOLN
500.0000 mL | INTRAVENOUS | Status: DC | PRN
  Administered 2017-12-26: 500 mL via INTRAVENOUS

## 2017-12-26 MED ORDER — SOD CITRATE-CITRIC ACID 500-334 MG/5ML PO SOLN
30.0000 mL | ORAL | Status: DC | PRN
Start: 2017-12-26 — End: 2017-12-27

## 2017-12-26 MED ORDER — DIPHENHYDRAMINE HCL 50 MG/ML IJ SOLN: 12.5000 mg | INTRAMUSCULAR | Status: DC | PRN

## 2017-12-26 MED ORDER — FENTANYL 2.5 MCG/ML BUPIVACAINE 1/10 % EPIDURAL INFUSION (WH - ANES)
INTRAMUSCULAR | Status: DC | PRN
  Administered 2017-12-26: 12 mL/h via EPIDURAL

## 2017-12-26 MED ORDER — LACTATED RINGERS IV SOLN: 500.0000 mL | Freq: Once | INTRAVENOUS | Status: DC

## 2017-12-26 MED ORDER — LIDOCAINE HCL (PF) 1 % IJ SOLN
30.0000 mL | INTRAMUSCULAR | Status: AC | PRN
  Administered 2017-12-26: 1.2 mL via SUBCUTANEOUS

## 2017-12-26 MED ORDER — SODIUM CHLORIDE 0.9 % IV SOLN
2.0000 g | Freq: Once | INTRAVENOUS | Status: AC
  Administered 2017-12-26: 2 g via INTRAVENOUS
  Filled 2017-12-26: qty 2000

## 2017-12-26 MED ORDER — OXYTOCIN BOLUS FROM INFUSION
500.0000 mL | Freq: Once | INTRAVENOUS | Status: AC
  Administered 2017-12-27: 500 mL via INTRAVENOUS

## 2017-12-26 MED ORDER — LIDOCAINE-EPINEPHRINE (PF) 1.5 %-1:200000 IJ SOLN
INTRAMUSCULAR | Status: DC | PRN
  Administered 2017-12-26: 3 mL via EPIDURAL

## 2017-12-26 NOTE — H&P (Signed)
OB History & Physical   History of Present Illness:  Chief Complaint: sent over from office due to contractions and advanced dilation  HPI:  Kayla Parrish is a 27 y.o. G2P1001 female at 6853w2d dated by LMP consistent with 6 week ultrasound.  Her pregnancy has been complicated by di-di twin pregnacny, history of LEEP procedure, obesity with BMI 36., anemia from iron deficiency.  She reports contractions.   She denies leakage of fluid.   She denies vaginal bleeding.   She reports fetal movement x 2.  She has had a mild headache off an on for the past few days. She denies visual changes and right upper quadrant pain.   Last growth ultrasound 8/2, Twin A 2,704 grams (31st %ile), Twin B 2,609 grams (25th %ile). Ultrasound today, twin A cephalic, twin B transverse -head midline. AFI normal for both.   Pelvis tested to 3,232 grams.  Total maternal weight gain: 8 pounds  Maternal Medical History:   Past Medical History:  Diagnosis Date  . Acute gallstone pancreatitis   . Cholelithiases 01/16/2015  . Pancreatitis, acute 01/16/2015    Past Surgical History:  Procedure Laterality Date  . CHOLECYSTECTOMY N/A 01/19/2015   Procedure: LAPAROSCOPIC CHOLECYSTECTOMY WITH INTRAOPERATIVE CHOLANGIOGRAM;  Surgeon: Ida Roguehristopher Lundquist, MD;  Location: [facility] ORS;  Service: General;  Laterality: N/A;    Allergies  Allergen Reactions  . Tramadol Anaphylaxis and Rash    Prior to Admission medications   Medication Sig Start Date End Date Taking? Authorizing Provider  ondansetron (ZOFRAN ODT) 4 MG disintegrating tablet Take 1 tablet (4 mg total) by mouth every 6 (six) hours as needed for nausea. 05/20/17  Yes Nadara MustardHarris, Robert P, MD  Prenatal Vit-Fe Fumarate-FA (MULTIVITAMIN-PRENATAL) 27-0.8 MG TABS tablet Take 1 tablet by mouth daily at 12 noon.   Yes [provider]    OB History  Gravida Para Term Preterm AB Living  2 1 1     1   SAB TAB Ectopic Multiple Live Births          1    # Outcome  Date GA Lbr Len/2nd Weight Sex Delivery Anes PTL Lv  2 Current           1 Term 07/12/14 8258w1d  3232 g F Vag-Spont   LIV    Prenatal care site: [facility]  Social History: She  reports that she has never smoked. She has never used smokeless tobacco. She reports that she drinks alcohol. She reports that she does not use drugs.  Family History: family history includes Diabetes in her paternal grandmother; Heart disease in her paternal grandmother; Hypertension in her paternal grandmother; Hypothyroidism in her mother; Kidney disease in her paternal grandmother; Thyroid disease in her mother.   Review of Systems: Negative x 10 systems reviewed except as noted in the HPI.    Physical Exam:  Vital Signs: BP (!) 144/76   Pulse 85   Temp 98.7 F (37.1 C) (Oral)   Resp 16   Ht 5' (1.524 m)   Wt 87.5 kg   LMP 04/09/2017 (Exact Date)   BMI 37.69 kg/m  Constitutional: Well nourished, well developed female in no acute distress.  HEENT: normal Skin: Warm and dry.  Cardiovascular: Regular rate and rhythm.   Extremity: no edema  Respiratory: Clear to auscultation bilateral. Normal respiratory effort Abdomen: FHT present and gravid Back: no CVAT Neuro: DTRs 2+, Cranial nerves grossly intact Psych: Alert and Oriented x3. No memory deficits. Normal mood and affect.  MS:  normal gait, normal bilateral lower extremity ROM/strength/stability.  Pelvic exam:  4 cm in clinic, 6cm on L&D by RN   Pertinent Results:  Prenatal Labs: Blood type/Rh O positive  Antibody screen negative  Rubella Immune  Varicella Immune    RPR NR  HBsAg negative  HIV negative  GC negative  Chlamydia negative  Genetic screening declined  1 hour GTT Early 140 (early 3h-nml all values), 28 week 103  GBS positive on 12/20/17   Baby A:  Baseline FHR: 130 beats/min   Variability: moderate   Accelerations: present   Decelerations: absent Overall assessment: cat 1  Baby B Baseline FHR: 140 beats/min    Variability: moderate   Accelerations: present   Decelerations: absent Overall assessment: cat 1  Contractions: 3 q 10 min  Assessment:  Kayla Parrish is a 27 y.o. G55P1001 female at [redacted]w[redacted]d with di-di twins, anemia due to iron deficiency, active labor.   Plan:  1. Admit to Labor & Delivery  2. CBC, T&S, Clrs, IVF 3. GBS positive: Ampicillin  4. Fetwal well-being: reassuring x 2 5. Mode of delivery: the patient has been extensively counseled regarding mode of delivery. She understands that ACOG states that it is reasonable to deliver di-di twins vaginally, as long as the presenting twin is cephalic.  Will plan for set up in the OR. C-section for the standard indications, otherwise.   Thomasene Mohair, MD 12/26/2017 8:34 PM

## 2017-12-26 NOTE — Anesthesia Preprocedure Evaluation (Signed)
Anesthesia Evaluation  Patient identified by MRN, date of birth, ID band Patient awake    Reviewed: Allergy & Precautions, NPO status , Patient's Chart, lab work & pertinent test results  History of Anesthesia Complications Negative for: history of anesthetic complications  Airway Mallampati: II       Dental   Pulmonary neg sleep apnea, neg COPD,           Cardiovascular (-) hypertension(-) Past MI and (-) CHF (-) dysrhythmias (-) Valvular Problems/Murmurs     Neuro/Psych neg Seizures    GI/Hepatic Neg liver ROS, GERD (with pregnancy)  ,  Endo/Other  neg diabetes  Renal/GU negative Renal ROS     Musculoskeletal   Abdominal   Peds  Hematology  (+) anemia ,   Anesthesia Other Findings   Reproductive/Obstetrics                             Anesthesia Physical Anesthesia Plan  ASA: II  Anesthesia Plan: Epidural   Post-op Pain Management:    Induction:   PONV Risk Score and Plan:   Airway Management Planned:   Additional Equipment:   Intra-op Plan:   Post-operative Plan:   Informed Consent: I have reviewed the patients History and Physical, chart, labs and discussed the procedure including the risks, benefits and alternatives for the proposed anesthesia with the patient or authorized representative who has indicated his/her understanding and acceptance.     Plan Discussed with:   Anesthesia Plan Comments:         Anesthesia Quick Evaluation

## 2017-12-26 NOTE — Anesthesia Procedure Notes (Signed)
Epidural Patient location during procedure: OB Start time: 12/26/2017 8:10 PM End time: 12/26/2017 8:34 PM  Staffing Performed: anesthesiologist   Preanesthetic Checklist Completed: patient identified, site marked, surgical consent, pre-op evaluation, timeout performed, IV checked, risks and benefits discussed and monitors and equipment checked  Epidural Patient position: sitting Prep: Betadine Patient monitoring: heart rate, continuous pulse ox and blood pressure Approach: midline Location: L4-L5 Injection technique: LOR saline  Needle:  Needle type: Tuohy  Needle gauge: 17 G Needle length: 9 cm and 9 Needle insertion depth: 9 cm Catheter type: closed end flexible Catheter size: 20 Guage Catheter at skin depth: 14 cm Test dose: negative and 1.5% lidocaine with Epi 1:200 K  Assessment Events: blood not aspirated, injection not painful, no injection resistance, negative IV test and no paresthesia  Additional Notes   Patient tolerated the insertion well without complications.Reason for block:procedure for pain

## 2017-12-26 NOTE — Progress Notes (Signed)
ROB/AFI C/o Contractions every 15 mins, having headaches, been having nausea in last couple days, some dizziness   Desires cervical check

## 2017-12-26 NOTE — Progress Notes (Signed)
Patient's preeclampsia labs returned:  Lab Results  Component Value Date   WBC 9.5 12/26/2017   HGB 9.0 (L) 12/26/2017   HCT 26.4 (L) 12/26/2017   PLT 236 12/26/2017   CREATININE 0.58 12/26/2017   ALT 7 12/26/2017   AST 22 12/26/2017   PROTCRRATIO 0.78 (H) 12/26/2017   She had several elevated blood pressures and then received an epidural.  She has no neurovascular symptoms.    The blood pressure values along with her proteinuria are consistent with preeclampsia.   Plan of treatment is to deliver, which is already underway. First dose of ampicillin at 2000.  Will augment if not progressing at around 0100.   Thomasene MohairStephen Markell Sciascia, MD, Merlinda FrederickFACOG [facility], [facility] 12/26/2017 10:57 PM

## 2017-12-27 ENCOUNTER — Encounter: Payer: Self-pay | Admitting: Obstetrics and Gynecology

## 2017-12-27 DIAGNOSIS — O99824 Streptococcus B carrier state complicating childbirth: Secondary | ICD-10-CM

## 2017-12-27 DIAGNOSIS — D509 Iron deficiency anemia, unspecified: Secondary | ICD-10-CM

## 2017-12-27 DIAGNOSIS — Z3A37 37 weeks gestation of pregnancy: Secondary | ICD-10-CM

## 2017-12-27 DIAGNOSIS — O99214 Obesity complicating childbirth: Secondary | ICD-10-CM

## 2017-12-27 DIAGNOSIS — O9902 Anemia complicating childbirth: Secondary | ICD-10-CM

## 2017-12-27 DIAGNOSIS — O30043 Twin pregnancy, dichorionic/diamniotic, third trimester: Secondary | ICD-10-CM

## 2017-12-27 LAB — CBC
HCT: 27.1 % — ABNORMAL LOW (ref 35.0–47.0)
HEMATOCRIT: 26.2 % — AB (ref 35.0–47.0)
HEMOGLOBIN: 8.7 g/dL — AB (ref 12.0–16.0)
Hemoglobin: 8.7 g/dL — ABNORMAL LOW (ref 12.0–16.0)
MCH: 24.7 pg — ABNORMAL LOW (ref 26.0–34.0)
MCH: 25.3 pg — ABNORMAL LOW (ref 26.0–34.0)
MCHC: 32 g/dL (ref 32.0–36.0)
MCHC: 33.3 g/dL (ref 32.0–36.0)
MCV: 77.2 fL — AB (ref 80.0–100.0)
MCV: 76.1 fL — ABNORMAL LOW (ref 80.0–100.0)
PLATELETS: 217 10*3/uL (ref 150–440)
Platelets: 215 10*3/uL (ref 150–440)
RBC: 3.5 MIL/uL — AB (ref 3.80–5.20)
RBC: 3.45 MIL/uL — AB (ref 3.80–5.20)
RDW: 15.6 % — AB (ref 11.5–14.5)
RDW: 15.7 % — AB (ref 11.5–14.5)
WBC: 12.2 10*3/uL — AB (ref 3.6–11.0)
WBC: 15.4 10*3/uL — AB (ref 3.6–11.0)

## 2017-12-27 MED ORDER — ONDANSETRON HCL 4 MG PO TABS: 4.0000 mg | ORAL_TABLET | ORAL | Status: DC | PRN

## 2017-12-27 MED ORDER — DIBUCAINE 1 % RE OINT: 1.0000 "application " | TOPICAL_OINTMENT | RECTAL | Status: DC | PRN

## 2017-12-27 MED ORDER — SIMETHICONE 80 MG PO CHEW: 80.0000 mg | CHEWABLE_TABLET | ORAL | Status: DC | PRN

## 2017-12-27 MED ORDER — TERBUTALINE SULFATE 1 MG/ML IJ SOLN: 0.2500 mg | Freq: Once | INTRAMUSCULAR | Status: DC | PRN

## 2017-12-27 MED ORDER — PRENATAL MULTIVITAMIN CH
1.0000 | ORAL_TABLET | Freq: Every day | ORAL | Status: DC
  Administered 2017-12-27 – 2017-12-28 (×2): 1 via ORAL
  Filled 2017-12-27 (×2): qty 1

## 2017-12-27 MED ORDER — ACETAMINOPHEN 325 MG PO TABS
650.0000 mg | ORAL_TABLET | ORAL | Status: DC | PRN
  Administered 2017-12-27 (×3): 650 mg via ORAL
  Filled 2017-12-27 (×3): qty 2

## 2017-12-27 MED ORDER — LACTATED RINGERS IV SOLN
INTRAVENOUS | Status: DC
  Administered 2017-12-27 – 2017-12-28 (×3): via INTRAVENOUS

## 2017-12-27 MED ORDER — WITCH HAZEL-GLYCERIN EX PADS: 1.0000 "application " | MEDICATED_PAD | CUTANEOUS | Status: DC | PRN

## 2017-12-27 MED ORDER — SENNOSIDES-DOCUSATE SODIUM 8.6-50 MG PO TABS
2.0000 | ORAL_TABLET | ORAL | Status: DC
  Administered 2017-12-28 (×2): 2 via ORAL
  Filled 2017-12-27 (×2): qty 2

## 2017-12-27 MED ORDER — HYDROCODONE-ACETAMINOPHEN 5-325 MG PO TABS
1.0000 | ORAL_TABLET | ORAL | Status: DC | PRN
  Administered 2017-12-27 – 2017-12-28 (×4): 1 via ORAL
  Filled 2017-12-27 (×4): qty 1

## 2017-12-27 MED ORDER — OXYTOCIN 40 UNITS IN LACTATED RINGERS INFUSION - SIMPLE MED
1.0000 m[IU]/min | INTRAVENOUS | Status: DC
  Administered 2017-12-27: 1 m[IU]/min via INTRAVENOUS

## 2017-12-27 MED ORDER — DIPHENHYDRAMINE HCL 25 MG PO CAPS: 25.0000 mg | ORAL_CAPSULE | Freq: Four times a day (QID) | ORAL | Status: DC | PRN

## 2017-12-27 MED ORDER — IBUPROFEN 600 MG PO TABS
600.0000 mg | ORAL_TABLET | Freq: Four times a day (QID) | ORAL | Status: DC
  Administered 2017-12-28 – 2017-12-29 (×3): 600 mg via ORAL
  Filled 2017-12-27 (×4): qty 1

## 2017-12-27 MED ORDER — FERROUS SULFATE 325 (65 FE) MG PO TABS
325.0000 mg | ORAL_TABLET | Freq: Two times a day (BID) | ORAL | Status: DC
  Administered 2017-12-27 – 2017-12-29 (×3): 325 mg via ORAL
  Filled 2017-12-27 (×3): qty 1

## 2017-12-27 MED ORDER — COCONUT OIL OIL
1.0000 "application " | TOPICAL_OIL | Status: DC | PRN
  Administered 2017-12-27: 1 via TOPICAL
  Filled 2017-12-27: qty 120

## 2017-12-27 MED ORDER — ONDANSETRON HCL 4 MG/2ML IJ SOLN
4.0000 mg | INTRAMUSCULAR | Status: DC | PRN
  Administered 2017-12-27 (×2): 4 mg via INTRAVENOUS
  Filled 2017-12-27 (×2): qty 2

## 2017-12-27 MED ORDER — BENZOCAINE-MENTHOL 20-0.5 % EX AERO
1.0000 "application " | INHALATION_SPRAY | CUTANEOUS | Status: DC | PRN
  Administered 2017-12-27: 1 via TOPICAL
  Filled 2017-12-27: qty 56

## 2017-12-27 NOTE — Anesthesia Preprocedure Evaluation (Addendum)
Anesthesia Evaluation  Patient identified by MRN, date of birth, ID band Patient awake    Reviewed: Allergy & Precautions, NPO status , Patient's Chart, lab work & pertinent test results, reviewed documented beta blocker date and time   History of Anesthesia Complications Negative for: history of anesthetic complications  Airway Mallampati: III  TM Distance: >3 FB     Dental  (+) Chipped   Pulmonary neg sleep apnea, neg COPD,           Cardiovascular (-) hypertension(-) Past MI and (-) CHF (-) dysrhythmias (-) Valvular Problems/Murmurs     Neuro/Psych neg Seizures  Neuromuscular disease    GI/Hepatic Neg liver ROS, GERD (with pregnancy)  Controlled,  Endo/Other  neg diabetes  Renal/GU negative Renal ROS     Musculoskeletal   Abdominal   Peds  Hematology  (+) anemia ,   Anesthesia Other Findings Past Medical History: No date: Acute gallstone pancreatitis 01/16/2015: Cholelithiases 01/16/2015: Pancreatitis, acute Obese.  Reproductive/Obstetrics                           Anesthesia Physical  Anesthesia Plan  ASA: II  Anesthesia Plan: General   Post-op Pain Management:    Induction: Intravenous  PONV Risk Score and Plan:   Airway Management Planned: Oral ETT  Additional Equipment:   Intra-op Plan:   Post-operative Plan:   Informed Consent: I have reviewed the patients History and Physical, chart, labs and discussed the procedure including the risks, benefits and alternatives for the proposed anesthesia with the patient or authorized representative who has indicated his/her understanding and acceptance.   History available from chart only  Plan Discussed with: CRNA  Anesthesia Plan Comments:        Anesthesia Quick Evaluation

## 2017-12-27 NOTE — Progress Notes (Signed)
Labor Check  Subj:  Complaints: comfortable with epidural   Obj:  BP (!) 116/55   Pulse 76   Temp 98.7 F (37.1 C) (Oral)   Resp 16   Ht 5' (1.524 m)   Wt 87.5 kg   LMP 04/09/2017 (Exact Date)   SpO2 96%   BMI 37.69 kg/m     Cervix: Dilation: 6 / Effacement (%): 80 / Station: -2   AROM - clear fluid  Baby A: Baseline FHR: 135 beats/min   Variability: moderate   Accelerations: present   Decelerations: absent Overall assessment: cat 1  Baby B: Baseline FHR: 125 beats/min   Variability: moderate   Accelerations: present   Decelerations: absent Overall assessment: cat 1  Contractions: present frequency: 3-4 q 10 min   A/P: 27 y.o. G2P1001 female at 2419w3d with di-di twins, labor, preeclampsia without severe features. .  1.  Labor: AROM, will start pitocin shortly  2.  FWB: reassuring x2 , Overall assessment: category 1 x both babies 3.  GBS positive - status post 2 doses ampicillin  4.  Pain: epidural 5.  Recheck: 1-2 hours, prn   Thomasene MohairStephen Jackson, MD, Merlinda FrederickFACOG [facility], [facility] 12/27/2017 2:32 AM

## 2017-12-27 NOTE — Discharge Summary (Signed)
OB Discharge Summary     Patient Name: Kayla Parrish DOB: 05-17-1991 MRN: [id number]  Date of admission: 12/26/2017 Delivering MD: Thomasene Mohair, MD  Date of Delivery: 12/27/2017  Date of discharge: 12/29/2017  Admitting diagnosis: diamniotic-dichorionic twin gestation Intrauterine pregnancy: [redacted]w[redacted]d     Secondary diagnosis: Preeclampsia and Anemia (iron deficiency)     Discharge diagnosis: Term Pregnancy Delivered, Preeclampsia (mild) and Anemia (iron deficiency and acute blood loss)                                                               Post partum procedures:postpartum tubal ligation  Augmentation: AROM and Pitocin  Complications: None  Hospital course:  Onset of Labor With Vaginal Delivery     27 y.o. yo G2P1001 at [redacted]w[redacted]d was admitted in Active Labor on 12/26/2017. Patient had an uncomplicated labor course as follows:  Membrane Rupture Time/Date:    Kayla Parrish [[id number]]  2:15 AM   Kayla Parrish [[id number]]  2:15 AM ,   Kayla Parrish [[id number]]  12/27/2017   Kayla Parrish [[id number]]  12/27/2017   Intrapartum Procedures: Episiotomy:    Kayla Parrish [[id number]]  None [1]   Kayla Parrish [[id number]]  None [1]                                         Lacerations:     Kayla Parrish [[id number]]  None [1]   Kayla Parrish [[id number]]  None [1]  Patient had a delivery of a Viable infant.   Kayla Parrish [[id number]]  12/27/2017    Kayla Parrish [[id number]]  12/27/2017 Information for the patient's newborn:  Kayla Parrish [[id number]]  Delivery Method: Vag-Spont Information for the patient's newborn:  Kayla Parrish [[id number]]  Delivery Method: Vag-Spont   Pateint had an uncomplicated postpartum course.  On PPD#1 she underwent a PP BTL without complication. Though she was severely anemic, she had no symptoms from her anemia. She denies HA, shortness of breath, feeling lightheaded,  having chest pain.  She was admitted with anemia and her overall hematocrit dropped by about 4%.  She is ambulating, tolerating a regular diet, passing flatus, and urinating well. She did have some mild elevated blood pressures, but was asymptomatic at discharge. Patient is discharged home in stable condition on 12/29/17.   Physical exam  Vitals:   12/28/17 1225 12/28/17 1655 12/29/17 0005 12/29/17 0742  BP: (!) 153/93 128/90 140/87 (!) 144/89  Pulse: 61 79 61 63  Resp: 16 18 18 18   Temp: (!) 97.5 F (36.4 C) 97.7 F (36.5 C) 97.7 F (36.5 C) 98.1 F (36.7 C)  TempSrc: Oral Oral Oral Oral  SpO2: 97% 98%  99%  Weight:      Height:       General: alert, cooperative and no distress  CV: RRR Pulm: CTAB Abd: soft NT/ND/+BS Lochia: appropriate Uterine Fundus: firm Incision: Healing well with no significant drainage, no erythema, induration, warmth, and tenderness  DVT Evaluation: No evidence of DVT seen on physical exam. No cords or calf tenderness. No significant calf/ankle edema.  Labs: Lab Results  Component Value Date   HCT 22.3 (  L) 12/28/2017   HCT 26.2 (L) 12/27/2017   HCT 27.1 (L) 12/27/2017    Discharge instruction: per After Visit Summary.  Medications:  Allergies as of 12/29/2017      Reactions   Tramadol Anaphylaxis, Rash      Medication List    STOP taking these medications   ondansetron 4 MG disintegrating tablet Commonly known as:  ZOFRAN-ODT     TAKE these medications   ferrous sulfate 325 (65 FE) MG tablet Take 1 tablet (325 mg total) by mouth 2 (two) times daily with a meal.   HYDROcodone-acetaminophen 5-325 MG tablet Commonly known as:  NORCO/VICODIN Take 1 tablet by mouth every 6 (six) hours as needed (breakthrough pain).   ibuprofen 600 MG tablet Commonly known as:  ADVIL,MOTRIN Take 1 tablet (600 mg total) by mouth every 6 (six) hours as needed for mild pain, moderate pain or cramping.   multivitamin-prenatal 27-0.8 MG Tabs tablet Take 1  tablet by mouth daily at 12 noon.            Discharge Care Instructions  (From admission, onward)         Start     Ordered   12/29/17 0000  Discharge wound care:    Comments:  Perform wound care instructions   12/29/17 1147          Diet: routine diet  Activity: Advance as tolerated. Pelvic rest for 6 weeks.   Outpatient follow up: Follow-up Information    Conard NovakJackson, Tiffanee Mcnee D, MD. Call in 1 week(s).   Specialty:  Obstetrics and Gynecology Why:  Blood pressure check Contact information: [address] [place] [address] [phone]             Postpartum contraception: Tubal Ligation Rhogam Given postpartum: no Rubella vaccine given postpartum: no Varicella vaccine given postpartum: no TDaP given antepartum or postpartum: 11/07/17  Newborn Data:   Kayla Parrish [[id number]][[id number]]  Live born female  Birth Weight: 5 lb 8.9 oz (2520 g) APGAR: 8, 9  Newborn Delivery   Birth date/time:  12/27/2017 05:02:00 Delivery type:  Vaginal, Spontaneous      Kayla Parrish [[id number]][[id number]]  Live born female  Birth Weight: 6 lb 1 oz (2750 g) APGAR: 2, 8  Newborn Delivery   Birth date/time:  12/27/2017 05:20:00 Delivery type:      Baby Feeding: formula  Disposition:home with mother  SIGNED: Thomasene MohairStephen Fleeta Kunde, MD, Merlinda FrederickFACOG [facility], [facility] 12/29/2017 11:56 AM

## 2017-12-27 NOTE — Progress Notes (Signed)
Routine Prenatal Care Visit  Subjective  Kayla Parrish is a 27 y.o. G2P1001 at [redacted]w[redacted]d being seen today for ongoing prenatal care.  She is currently monitored for the following issues for this high-risk pregnancy and has S/P cholecystectomy; Gastroesophageal reflux disease without esophagitis; Anemia; History of acute pancreatitis; Bilateral carpal tunnel syndrome; BMI 36.0-36.9,adult; History of loop electrosurgical excision procedure (LEEP) affecting care of mother in first trimester, antepartum; Supervision of high-risk pregnancy, third trimester; Dichorionic diamniotic twin pregnancy, antepartum; Indication for care in labor or delivery; and Normal labor on their problem list.  ----------------------------------------------------------------------------------- Patient reports headache.   Contractions: Irregular. Vag. Bleeding: Bloody Show.  Movement: Present. Denies leaking of fluid.  ----------------------------------------------------------------------------------- The following portions of the patient's history were reviewed and updated as appropriate: allergies, current medications, past family history, past medical history, past social history, past surgical history and problem list. Problem list updated.   Objective  Blood pressure 140/80, weight 193 lb (87.5 kg), last menstrual period 04/09/2017. Pregravid weight 185 lb (83.9 kg) Total Weight Gain 8 lb (3.629 kg) Urinalysis:      Fetal Status: Fetal Heart Rate (bpm): NST   Movement: Present  Presentation: Vertex  General:  Alert, oriented and cooperative. Patient is in no acute distress.  Skin: Skin is warm and dry. No rash noted.   Cardiovascular: Normal heart rate noted  Respiratory: Normal respiratory effort, no problems with respiration noted  Abdomen: Soft, gravid, appropriate for gestational age. Pain/Pressure: Absent     Pelvic:  Cervical exam performed Dilation: 5 Effacement (%): 80 Station: -2  Extremities: Normal  range of motion.     ental Status: Normal mood and affect. Normal behavior. Normal judgment and thought content.     Assessment   27 y.o. G2P1001 at [redacted]w[redacted]d by  01/14/2018, by Last Menstrual Period presenting for routine prenatal visit  Plan   2nd Pregnancy Problems (from 05/17/17 to present)    Problem Noted Resolved   Dichorionic diamniotic twin pregnancy, antepartum 06/25/2017 by Natale Milch, MD No   Overview Addendum 12/20/2017 11:37 AM by Vena Austria, MD    [X]   Growth [place] every 3-4 weeks starting at 24 weeks [X]  weekly NST/AFI or BPP starting at 36 weeks  36 week growth cephalic/transverse A 5lbs 15oz c/w 31.4% B 5lbs 12oz c/w 24.7%      Supervision of high-risk pregnancy, third trimester 05/28/2017 by Nadara Mustard, MD No   Overview Addendum 12/22/2017 12:58 PM by Vena Austria, MD      [facility] Prenatal Labs  Dating  T1 US=LMP Blood type: O/Positive/-- (01/08 1511)   Genetic Screen  Declines   Antibody:Negative (01/08 1511)  Anatomic [place] Normal female/female Rubella: 2.58 (01/08 1511) Varicella: Immune  GTT Early: 140 3hr7 75, 102, 110, 107 28 wk: 104 RPR: Non Reactive (01/08 1511)   Rhogam  not needed HBsAg: Negative (01/08 1511)   TDaP vaccine 11/07/2017          HIV: Non Reactive (01/08 1511)   Flu Shot Declines                             QIO:[id number]  Contraception IUD Pap: August 24, 2017 NIL  CBB   given information Pelvis tested to 7lbs 2oz  CS/Vaginal Vaginal   Baby Food "going to try pumping"   Support Person  Husband Molli Hazard               Gestational  age appropriate obstetric precautions including but not limited to vaginal bleeding, contractions, leaking of fluid and fetal movement were reviewed in detail with the patient.    Favorable cervix, membranes swept. Headache and elevated blood pressure, sent to triage for pre-eclampsia evaluation NST today scheduled at the hospital after her visit.   Return in about 1 week (around  01/02/2018) for ROB.  Adelene Idlerhristanna Kalyna Paolella MD [facility], [facility] 12/27/17 10:40 AM

## 2017-12-27 NOTE — Progress Notes (Signed)
Labor Check  Subj:  Complaints: more painful contractions, not fully controlled by epidural   Obj:  BP (!) 147/78 Comment: pt starting to feel ctx  Pulse 81   Temp 98.7 F (37.1 C) (Oral)   Resp 16   Ht 5' (1.524 m)   Wt 87.5 kg   LMP 04/09/2017 (Exact Date)   SpO2 99%   BMI 37.69 kg/m  Dose (milli-units/min) Oxytocin: 1 milli-units/min  Cervix: Dilation: 9 / Effacement (%): 90, 100 / Station: 0   Female chaperone present for pelvic exam:   Baby A: Baseline FHR: 135 beats/min   Variability: moderate   Accelerations: present   Decelerations: absent Overall assessment: cat 1  Baby B: Baseline FHR: 120 beats/min   Variability: moderate   Accelerations: present   Decelerations: absent Overall assessment: cat 1  Contractions: present frequency: 4-5 q 10 min   A/P: 27 y.o. G2P1001 female at 4039w3d with di-di twins, labor, preeclampsia without severe features.  1.  Labor: continue pitocin  2.  FWB: reassuring x 2, Overall assessment: category 1 for both  3.  GBS positive, continue ampicillin  4.  Pain: epidural 5.  Recheck: 30-45 minutes. 6.  Expect vaginal delivery. Will move to OR once complete and nearly ready to push.  Peds, anesthesia, nursing aware.    Kayla MohairStephen Collis Thede, MD, Merlinda FrederickFACOG [facility], [facility] 12/27/2017 4:15 AM   12/27/2017 4:15 AM

## 2017-12-27 NOTE — Progress Notes (Signed)
Patient counseled regarding BTL. She is absolutely sure she would like a BTL. She has been counseled regarding alternatives that are not permanent. She further understands that there is a failure rate of 3-5 in every 1,000 in a given year with an increase in risk of ectopic pregnancy should pregnancy occur.   The OR has been notified and she will be scheduled for tomorrow.   Thomasene MohairStephen Rolly Magri, MD, Merlinda FrederickFACOG [facility], [facility] 12/27/2017 7:21 AM

## 2017-12-27 NOTE — Lactation Note (Signed)
This note was copied from a baby's chart. Lactation Consultation Note  Patient Name: Kayla Parrish ZOXWR'UToday's Date: 12/27/2017 Reason for consult: Initial assessment   Maternal Data Formula Feeding for Exclusion: No Does the patient have breastfeeding experience prior to this delivery?: No  Feeding Feeding Type: Breast Fed(Attempted, Infant sleepy and would not suck)  LATCH Score Latch: Too sleepy or reluctant, no latch achieved, no sucking elicited.  Audible Swallowing: None  Type of Nipple: Everted at rest and after stimulation  Comfort (Breast/Nipple): Soft / non-tender  Hold (Positioning): Full assist, staff holds infant at breast  LATCH Score: 4  Interventions Interventions: Skin to skin  Lactation Tools Discussed/Used     Consult Status  LC to room to assist with breastfeeding. Mother states that infant (Baby B)  is showing signs of hunger and was placed at the breast. Infant started to fall asleep and did minimal sucking at the breast. LC used a gloved finger to assess suck and infant initially chewed with a few uncoordinated sucks. Infant was placed skin to skin with mother. LC reviewed what to expect in the first 24 hours and the benefits of skin to skin.    [name] 12/27/2017, 1:14 PM

## 2017-12-27 NOTE — Lactation Note (Signed)
This note was copied from a baby's chart. Lactation Consultation Note  Patient Name: Kayla Parrish Today's Date: 12/27/2017     Maternal Data    Feeding    LATCH Score                   Interventions    Lactation Tools Discussed/Used     Consult Status  LC talked to mother about plans for breastfeeding. Mother states that she would like to exclusively pump and is ok with using formula if needed. Mother states that she will be returning to work and does not foresee breastfeeding for a long period of time. Mother was instructed to pump every 2 hours and is familiar with how to use the medela pump. She states she has a medela pump at home. Mother denies additional questions at this time.    [name] 12/27/2017, 4:34 PM

## 2017-12-28 ENCOUNTER — Inpatient Hospital Stay: Payer: BC Managed Care – PPO | Admitting: Anesthesiology

## 2017-12-28 ENCOUNTER — Encounter
Admission: EM | Disposition: A | Discharge status: Home / Self Care | Payer: Self-pay | Source: Home / Self Care | Attending: Obstetrics and Gynecology

## 2017-12-28 ENCOUNTER — Encounter: Payer: Self-pay | Admitting: Anesthesiology

## 2017-12-28 DIAGNOSIS — Z302 Encounter for sterilization: Secondary | ICD-10-CM

## 2017-12-28 HISTORY — PX: TUBAL LIGATION: SHX77

## 2017-12-28 LAB — CBC
HEMATOCRIT: 22.3 % — AB (ref 35.0–47.0)
HEMOGLOBIN: 7.4 g/dL — AB (ref 12.0–16.0)
MCH: 25.4 pg — AB (ref 26.0–34.0)
MCHC: 33.3 g/dL (ref 32.0–36.0)
MCV: 76.5 fL — AB (ref 80.0–100.0)
PLATELETS: 211 10*3/uL (ref 150–440)
RBC: 2.92 MIL/uL — ABNORMAL LOW (ref 3.80–5.20)
RDW: 15.6 % — ABNORMAL HIGH (ref 11.5–14.5)
WBC: 13.2 10*3/uL — ABNORMAL HIGH (ref 3.6–11.0)

## 2017-12-28 LAB — RPR: RPR Ser Ql: NONREACTIVE

## 2017-12-28 SURGERY — LIGATION, FALLOPIAN TUBE, POSTPARTUM
Anesthesia: General | Laterality: Bilateral

## 2017-12-28 MED ORDER — LIDOCAINE HCL (PF) 2 % IJ SOLN
INTRAMUSCULAR | Status: AC
  Filled 2017-12-28: qty 10

## 2017-12-28 MED ORDER — EPHEDRINE SULFATE 50 MG/ML IJ SOLN
INTRAMUSCULAR | Status: DC | PRN
  Administered 2017-12-28: 15 mg via INTRAVENOUS
  Administered 2017-12-28: 10 mg via INTRAVENOUS

## 2017-12-28 MED ORDER — SUCCINYLCHOLINE CHLORIDE 20 MG/ML IJ SOLN
INTRAMUSCULAR | Status: DC | PRN
  Administered 2017-12-28: 100 mg via INTRAVENOUS

## 2017-12-28 MED ORDER — PROPOFOL 10 MG/ML IV BOLUS
INTRAVENOUS | Status: DC | PRN
  Administered 2017-12-28: 200 mg via INTRAVENOUS

## 2017-12-28 MED ORDER — SUGAMMADEX SODIUM 200 MG/2ML IV SOLN
INTRAVENOUS | Status: AC
  Filled 2017-12-28: qty 2

## 2017-12-28 MED ORDER — FENTANYL CITRATE (PF) 100 MCG/2ML IJ SOLN
INTRAMUSCULAR | Status: AC
  Filled 2017-12-28: qty 2

## 2017-12-28 MED ORDER — BUPIVACAINE HCL (PF) 0.5 % IJ SOLN
INTRAMUSCULAR | Status: DC | PRN
  Administered 2017-12-28: 4 mL

## 2017-12-28 MED ORDER — DEXAMETHASONE SODIUM PHOSPHATE 10 MG/ML IJ SOLN
INTRAMUSCULAR | Status: AC
Start: 2017-12-28 — End: ?
  Filled 2017-12-28: qty 1

## 2017-12-28 MED ORDER — ONDANSETRON HCL 4 MG/2ML IJ SOLN
INTRAMUSCULAR | Status: AC
  Filled 2017-12-28: qty 2

## 2017-12-28 MED ORDER — LIDOCAINE HCL (CARDIAC) PF 100 MG/5ML IV SOSY
PREFILLED_SYRINGE | INTRAVENOUS | Status: DC | PRN
  Administered 2017-12-28: 100 mg via INTRAVENOUS

## 2017-12-28 MED ORDER — PROPOFOL 10 MG/ML IV BOLUS
INTRAVENOUS | Status: AC
  Filled 2017-12-28: qty 20

## 2017-12-28 MED ORDER — DEXAMETHASONE SODIUM PHOSPHATE 10 MG/ML IJ SOLN
INTRAMUSCULAR | Status: DC | PRN
  Administered 2017-12-28: 10 mg via INTRAVENOUS

## 2017-12-28 MED ORDER — LACTATED RINGERS IV SOLN: INTRAVENOUS | Status: DC

## 2017-12-28 MED ORDER — BUPIVACAINE HCL (PF) 0.5 % IJ SOLN
INTRAMUSCULAR | Status: AC
  Filled 2017-12-28: qty 30

## 2017-12-28 MED ORDER — SUCCINYLCHOLINE CHLORIDE 20 MG/ML IJ SOLN
INTRAMUSCULAR | Status: AC
  Filled 2017-12-28: qty 1

## 2017-12-28 MED ORDER — HYDROCODONE-ACETAMINOPHEN 5-325 MG PO TABS
2.0000 | ORAL_TABLET | ORAL | Status: DC | PRN
  Administered 2017-12-28 – 2017-12-29 (×4): 2 via ORAL
  Filled 2017-12-28 (×4): qty 2

## 2017-12-28 MED ORDER — SUGAMMADEX SODIUM 200 MG/2ML IV SOLN
INTRAVENOUS | Status: DC | PRN
  Administered 2017-12-28: 200 mg via INTRAVENOUS

## 2017-12-28 MED ORDER — HYDROCODONE-ACETAMINOPHEN 5-325 MG PO TABS
1.0000 | ORAL_TABLET | Freq: Once | ORAL | Status: AC
  Administered 2017-12-28: 1 via ORAL
  Filled 2017-12-28: qty 1

## 2017-12-28 MED ORDER — FENTANYL CITRATE (PF) 100 MCG/2ML IJ SOLN
25.0000 ug | INTRAMUSCULAR | Status: DC | PRN
  Administered 2017-12-28 (×4): 25 ug via INTRAVENOUS

## 2017-12-28 MED ORDER — ROCURONIUM BROMIDE 100 MG/10ML IV SOLN
INTRAVENOUS | Status: DC | PRN
  Administered 2017-12-28: 10 mg via INTRAVENOUS

## 2017-12-28 MED ORDER — MIDAZOLAM HCL 2 MG/2ML IJ SOLN
INTRAMUSCULAR | Status: AC
  Filled 2017-12-28: qty 2

## 2017-12-28 MED ORDER — FENTANYL CITRATE (PF) 100 MCG/2ML IJ SOLN
INTRAMUSCULAR | Status: DC | PRN
  Administered 2017-12-28 (×2): 50 ug via INTRAVENOUS

## 2017-12-28 MED ORDER — ONDANSETRON HCL 4 MG/2ML IJ SOLN: 4.0000 mg | Freq: Once | INTRAMUSCULAR | Status: DC | PRN

## 2017-12-28 MED ORDER — MENTHOL 3 MG MT LOZG
1.0000 | LOZENGE | OROMUCOSAL | Status: DC | PRN
  Filled 2017-12-28: qty 9

## 2017-12-28 MED ORDER — ONDANSETRON HCL 4 MG/2ML IJ SOLN
INTRAMUSCULAR | Status: DC | PRN
  Administered 2017-12-28: 4 mg via INTRAVENOUS

## 2017-12-28 SURGICAL SUPPLY — 28 items
BLADE SURG SZ11 CARB STEEL (BLADE) ×3 IMPLANT
CHLORAPREP W/TINT 26ML (MISCELLANEOUS) ×3 IMPLANT
DERMABOND ADVANCED (GAUZE/BANDAGES/DRESSINGS) ×2
DERMABOND ADVANCED .7 DNX12 (GAUZE/BANDAGES/DRESSINGS) ×1 IMPLANT
DRAPE LAPAROTOMY 77X122 PED (DRAPES) ×3 IMPLANT
ELECT CAUTERY BLADE 6.4 (BLADE) ×3 IMPLANT
ELECT REM PT RETURN 9FT ADLT (ELECTROSURGICAL) ×3
ELECTRODE REM PT RTRN 9FT ADLT (ELECTROSURGICAL) ×1 IMPLANT
GLOVE BIO SURGEON STRL SZ7 (GLOVE) ×9 IMPLANT
GLOVE BIOGEL PI IND STRL 7.5 (GLOVE) ×1 IMPLANT
GLOVE BIOGEL PI INDICATOR 7.5 (GLOVE) ×2
GOWN STRL REUS W/ TWL LRG LVL3 (GOWN DISPOSABLE) ×2 IMPLANT
GOWN STRL REUS W/ TWL XL LVL3 (GOWN DISPOSABLE) IMPLANT
GOWN STRL REUS W/TWL LRG LVL3 (GOWN DISPOSABLE) ×4
GOWN STRL REUS W/TWL XL LVL3 (GOWN DISPOSABLE)
KIT TURNOVER CYSTO (KITS) ×3 IMPLANT
LABEL OR SOLS (LABEL) ×3 IMPLANT
NEEDLE HYPO 22GX1.5 SAFETY (NEEDLE) ×3 IMPLANT
NS IRRIG 500ML POUR BTL (IV SOLUTION) ×3 IMPLANT
PACK BASIN MINOR ARMC (MISCELLANEOUS) ×3 IMPLANT
SUT MNCRL 4-0 (SUTURE) ×2
SUT MNCRL 4-0 27XMFL (SUTURE) ×1
SUT PLAIN GUT 0 (SUTURE) ×6 IMPLANT
SUT VIC AB 2-0 UR6 27 (SUTURE) ×3 IMPLANT
SUT VIC AB 3-0 SH 27 (SUTURE) ×2
SUT VIC AB 3-0 SH 27X BRD (SUTURE) ×1 IMPLANT
SUTURE MNCRL 4-0 27XMF (SUTURE) ×1 IMPLANT
SYR 10ML LL (SYRINGE) ×3 IMPLANT

## 2017-12-28 NOTE — Lactation Note (Signed)
This note was copied from a baby's chart. Lactation Consultation Note  Patient Name: Jannet MantisGirlA Marriah Vogl Today's Date: 12/28/2017     Maternal Data  Mom states that she no longer wants to pump her breasts, she will formula feed, she does have an electric breast pump at home.  I encouraged her to call LC if she decides to pump again or if she has concerns or questions   Feeding Feeding Type: Bottle Fed - Formula Nipple Type: Slow - flow  LATCH Score                   Interventions    Lactation Tools Discussed/Used     Consult Status      Dyann KiefMarsha D Waunita Sandstrom 12/28/2017, 5:21 PM

## 2017-12-28 NOTE — Progress Notes (Signed)
Patient transported to OR for postpartum bilateral tubal ligation at this time.     Oswald HillockAbigail Garner, RN

## 2017-12-28 NOTE — Op Note (Signed)
Operative Note Postpartum Tubal Ligation  Pre-Op Diagnosis: multiparity, desires permanent sterilization  Post-Op Diagnosis: multiparity, desires permanent sterilization  Procedures:  Postpartum tubal ligation via Pomeroy method  Primary Surgeon: Thomasene MohairStephen Chae Oommen, MD  EBL: 5 ml   IVF: 600 mL   Specimens: portion of right and left fallopian tubes  Drains: None  Complications: None   Disposition: PACU   Condition: Stable   Findings: normal appearing bilateral fallopian tubes  Indication: The patient is a 27 y.o. G2P2003 who is postpartum day 1 status post spontaneous vaginal delivery of di-di twins.  She has been counseled extensively regarding risks, benefits, and alternatives to tubal ligation, including non-permanent forms of contraception that are equivalent in efficacy with potentially better side effects.  She has been advised that there is a failure rate of 3-5 in every 1,000 tubal ligations per year with an increased risk of ectopic pregnancy should pregnancy occur.   Procedure Summary:  The patient was taken to the operating room where general anesthesia was administered and found to be adequate. After timeout was called a small transverse, infraumbilical incision was made with the scalpel. The incision was carried down through the fascia until the peritoneum was identified and entered. The peritoneum was noted to be free of any adhesions and the incision was then extended.  The patient's left fallopian tube was identified, brought incision, and grasped with a Babcock clamp. The tube was then followed out to the fimbria. The Babcock clamp was then used to grasp the tube approximately 4 cm from the cornual region. A 3 cm segment of tube was then ligated with the 2 free ties of plain gut, and excised. Good hemostasis was noted and the tube was returned to the abdomen. The right fallopian tube was then ligated, and a 3 cm segment excised in a similar fashion. Excellent hemostasis  was noted, and the tube returned to the abdomen.  The peritoneum and fascia were closed in a single layer using 0 Vicryl. The skin was closed in a subcuticular fashion using 4-0 monocryl. The closure was also closed with Dermabond.  The patient tolerated the procedure well. Sponge, lap, and needle counts were correct 2. The patient was taken to the recovery room in stable condition.  For VTE prophylaxis she was wearing SCDs.  No antibiotics ordered nor indicated.   Thomasene MohairStephen Kenndra Morris, MD 12/28/2017 10:20 AM

## 2017-12-28 NOTE — Anesthesia Procedure Notes (Addendum)
Procedure Name: Intubation Date/Time: 12/28/2017 9:22 AM Performed by: Estanislado EmmsShort, Liliah Dorian L, CRNA Pre-anesthesia Checklist: Patient identified, Patient being monitored, Timeout performed, Emergency Drugs available and Suction available Patient Re-evaluated:Patient Re-evaluated prior to induction Oxygen Delivery Method: Circle system utilized Preoxygenation: Pre-oxygenation with 100% oxygen Induction Type: IV induction Laryngoscope Size: Miller and 2 Grade View: Grade I Tube type: Oral Tube size: 6.5 mm Number of attempts: 1 Airway Equipment and Method: Stylet Placement Confirmation: ETT inserted through vocal cords under direct vision,  positive ETCO2 and breath sounds checked- equal and bilateral Secured at: 21 cm Tube secured with: Tape Dental Injury: Teeth and Oropharynx as per pre-operative assessment

## 2017-12-28 NOTE — Progress Notes (Signed)
15 minute call to floor. 

## 2017-12-28 NOTE — Transfer of Care (Signed)
Immediate Anesthesia Transfer of Care Note  Patient: Kayla Parrish  Procedure(s) Performed: POST PARTUM TUBAL LIGATION (Bilateral )  Patient Location: PACU  Anesthesia Type:General  Level of Consciousness: awake, alert  and oriented  Airway & Oxygen Therapy: Patient Spontanous Breathing and Patient connected to nasal cannula oxygen  Post-op Assessment: Report given to RN and Post -op Vital signs reviewed and stable  Post vital signs: Reviewed and stable  Last Vitals:  Vitals Value Taken Time  BP 153/87 12/28/2017 10:08 AM  Temp 36.3 C 12/28/2017 10:08 AM  Pulse 76 12/28/2017 10:09 AM  Resp 24 12/28/2017 10:09 AM  SpO2 97 % 12/28/2017 10:09 AM  Vitals shown include unvalidated device data.  Last Pain:  Vitals:   12/28/17 1008  TempSrc:   PainSc: Asleep         Complications: No apparent anesthesia complications

## 2017-12-28 NOTE — Progress Notes (Signed)
Obstetric Postpartum Daily Progress Note Subjective:  27 y.o. G2P1001 postpartum day #1 status post vaginal delivery of di-di twins.  She is ambulating, is tolerating po, is voiding spontaneously.  Her pain is well controlled on PO pain medications. Her lochia is less than menses.  Denies HA, visual changes, and RUQ pain. Strongly desires permanent sterility.   Medications SCHEDULED MEDICATIONS  . ferrous sulfate  325 mg Oral BID WC  . ibuprofen  600 mg Oral Q6H  . prenatal multivitamin  1 tablet Oral Q1200  . senna-docusate  2 tablet Oral Q24H    MEDICATION INFUSIONS  . lactated ringers 125 mL/hr at 12/28/17 0210    PRN MEDICATIONS  acetaminophen, benzocaine-Menthol, coconut oil, witch hazel-glycerin **AND** dibucaine, diphenhydrAMINE, HYDROcodone-acetaminophen, ondansetron **OR** ondansetron (ZOFRAN) IV, simethicone    Objective:   Vitals:   12/27/17 1526 12/27/17 1947 12/27/17 2331 12/28/17 0743  BP: (!) 136/91 136/78 134/88 127/87  Pulse: 72 76 69 (!) 56  Resp: 18 20 18 18   Temp: 98.7 F (37.1 C) 98.3 F (36.8 C) 97.9 F (36.6 C) 97.8 F (36.6 C)  TempSrc: Oral Oral Oral Oral  SpO2: 99% 100%  97%  Weight:      Height:        Current Vital Signs 24h Vital Sign Ranges  T 97.8 F (36.6 C) Temp  Avg: 98.3 F (36.8 C)  Min: 97.8 F (36.6 C)  Max: 99 F (37.2 C)  BP 127/87 BP  Min: 127/87  Max: 149/90  HR (!) 56 Pulse  Avg: 69.2  Min: 56  Max: 76  RR 18 Resp  Avg: 18.3  Min: 18  Max: 20  SaO2 97 % Room Air SpO2  Avg: 98.8 %  Min: 97 %  Max: 100 %       24 Hour I/O Current Shift I/O  Time Ins Outs 08/09 0701 - 08/10 0700 In: -  Out: 1674 [Urine:1500] No intake/output data recorded.  General: NAD CV: RRR Pulmonary: no increased work of breathing, CTAB Abdomen: non-distended, non-tender, fundus firm at level of umbilicus Extremities: no edema, no erythema, no tenderness  Labs:  Recent Labs  Lab 12/27/17 0617 12/27/17 1359 12/28/17 0427  WBC 12.2* 15.4* 13.2*   HGB 8.7* 8.7* 7.4*  HCT 27.1* 26.2* 22.3*  PLT 217 215 211    Assessment:   27 y.o. G2P1001 postpartum day # 1 status post SVD, desires permanent sterility, acute on chronic anemia with acute worsening due to blood loss in delivery with chronic iron deficiency.   Plan:   1) Acute blood loss anemia - hemodynamically stable and asymptomatic - po ferrous sulfate  2) O POS / Rubella 2.58 (01/08 1511)/ Varicella Immune  3) TDAP status received   4) formula feeding /Contraception = bilateral tubal ligation 27 y.o. G2P1001  with undesired fertility, desires permanent sterilization.  Other reversible forms of contraception were discussed with patient; she declines all other modalities. Permanent nature of as well as associated risks of the procedure discussed with patient including but not limited to: risk of regret, permanence of method, bleeding, infection, injury to surrounding organs and need for additional procedures.  Failure risk of 0.5-1% with increased risk of ectopic gestation if pregnancy occurs was also discussed with patient.   Patient NPO since midnight.    5) Disposition: home PPD#1-2   Thomasene MohairStephen Kei Mcelhiney, MD 12/28/2017 8:29 AM

## 2017-12-28 NOTE — Anesthesia Postprocedure Evaluation (Signed)
Anesthesia Post Note  Patient: Kayla Parrish  Procedure(s) Performed: POST PARTUM TUBAL LIGATION (Bilateral )  Patient location during evaluation: PACU Anesthesia Type: General Level of consciousness: awake and alert Pain management: pain level controlled Vital Signs Assessment: post-procedure vital signs reviewed and stable Respiratory status: spontaneous breathing, nonlabored ventilation, respiratory function stable and patient connected to nasal cannula oxygen Cardiovascular status: blood pressure returned to baseline and stable Postop Assessment: no apparent nausea or vomiting Anesthetic complications: no     Last Vitals:  Vitals:   12/28/17 1038 12/28/17 1040  BP: (!) 158/82   Pulse: 70 69  Resp: 14 (!) 22  Temp:    SpO2: 98% 93%    Last Pain:  Vitals:   12/28/17 1040  TempSrc:   PainSc: 2                  Abdulai Blaylock S

## 2017-12-28 NOTE — Anesthesia Post-op Follow-up Note (Signed)
Anesthesia QCDR form completed.        

## 2017-12-28 NOTE — Progress Notes (Signed)
Patient placed on bedpan for urination.

## 2017-12-29 ENCOUNTER — Encounter: Payer: Self-pay | Admitting: Obstetrics and Gynecology

## 2017-12-29 DIAGNOSIS — O163 Unspecified maternal hypertension, third trimester: Secondary | ICD-10-CM | POA: Diagnosis present

## 2017-12-29 MED ORDER — IBUPROFEN 600 MG PO TABS: 600.0000 mg | ORAL_TABLET | Freq: Four times a day (QID) | ORAL | 0 refills | Status: DC | PRN

## 2017-12-29 MED ORDER — FERROUS SULFATE 325 (65 FE) MG PO TABS: 325.0000 mg | ORAL_TABLET | Freq: Two times a day (BID) | ORAL | 3 refills | Status: DC

## 2017-12-29 MED ORDER — HYDROCODONE-ACETAMINOPHEN 5-325 MG PO TABS: 1.0000 | ORAL_TABLET | Freq: Four times a day (QID) | ORAL | 0 refills | Status: DC | PRN

## 2017-12-29 NOTE — Anesthesia Postprocedure Evaluation (Signed)
Anesthesia Post Note  Patient: Kayla Parrish  Procedure(s) Performed: AN AD HOC INTUBATION  Patient location during evaluation: Mother Baby Anesthesia Type: Epidural Level of consciousness: awake and alert Pain management: pain level controlled Vital Signs Assessment: post-procedure vital signs reviewed and stable Respiratory status: spontaneous breathing, nonlabored ventilation and respiratory function stable Cardiovascular status: stable Postop Assessment: no headache, no backache and epidural receding Anesthetic complications: no     Last Vitals:  Vitals:   12/29/17 0005 12/29/17 0742  BP: 140/87 (!) 144/89  Pulse: 61 63  Resp: 18 18  Temp: 36.5 C 36.7 C  SpO2:  99%    Last Pain:  Vitals:   12/29/17 0842  TempSrc:   PainSc: 4                  Sahvannah Rieser S

## 2017-12-29 NOTE — Progress Notes (Signed)
Provided period of purple cry video for mother/father to watch. Parents watched the video with their last child, and refused to watch the video, but voiced no questions/concerns regarding information provided at this time. 

## 2017-12-29 NOTE — Progress Notes (Signed)
Provided and reviewed discharge paperwork and prescriptions. Verified understanding by use of teach back method, pt verbalized understanding as well. Patient to make her own follow up appointment within a week with Dr. Jean RosenthalJackson. Discharged home with twin infants. Taken via wheelchair to visitor entrance to go home transported by husband.

## 2017-12-30 LAB — SURGICAL PATHOLOGY

## 2017-12-31 LAB — SURGICAL PATHOLOGY

## 2018-01-02 ENCOUNTER — Telehealth: Payer: Self-pay

## 2018-01-02 DIAGNOSIS — Z3A36 36 weeks gestation of pregnancy: Secondary | ICD-10-CM | POA: Diagnosis not present

## 2018-01-02 DIAGNOSIS — O30043 Twin pregnancy, dichorionic/diamniotic, third trimester: Secondary | ICD-10-CM

## 2018-01-02 NOTE — Telephone Encounter (Signed)
FMLA/DISABILITY form for [employer] filled out, signature obtained, and given to [place] for processing. 

## 2018-01-03 ENCOUNTER — Encounter: Payer: Self-pay | Admitting: Obstetrics and Gynecology

## 2018-01-03 ENCOUNTER — Ambulatory Visit (INDEPENDENT_AMBULATORY_CARE_PROVIDER_SITE_OTHER): Payer: BC Managed Care – PPO | Admitting: Obstetrics and Gynecology

## 2018-01-03 VITALS — BP 118/72 | HR 67 | Ht 60.0 in | Wt 168.0 lb

## 2018-01-03 DIAGNOSIS — O1495 Unspecified pre-eclampsia, complicating the puerperium: Secondary | ICD-10-CM | POA: Insufficient documentation

## 2018-01-03 DIAGNOSIS — D508 Other iron deficiency anemias: Secondary | ICD-10-CM

## 2018-01-03 NOTE — Progress Notes (Signed)
Obstetrics & Gynecology Office Visit   Chief Complaint  Patient presents with  . Post-op Follow-up    Pt does feel alittle sore, but overall doing ok    History of Present Illness: 27 y.o. G2P2003 who is postpartum day 7 from SVD of di-di twins.  She presents for a blood pressure check.  Her hospital course was complicated by acute-on-chronic anemia and  Preeclampsia without severe features (mild preeclampsia).  Today she denies symptoms of anemia, including; feeling weak, lightheaded, short of breath, headache, chest pain. She also denies symptoms of preeclampsia, including; headache, visual changes, and RUQ pain.  Her lochia is minimal at this time. The twins are doing well at home. She has no acute complaints at this time.    Past Medical History:  Diagnosis Date  . Acute gallstone pancreatitis   . Cholelithiases 01/16/2015  . Pancreatitis, acute 01/16/2015    Past Surgical History:  Procedure Laterality Date  . CHOLECYSTECTOMY N/A 01/19/2015   Procedure: LAPAROSCOPIC CHOLECYSTECTOMY WITH INTRAOPERATIVE CHOLANGIOGRAM;  Surgeon: Ida Roguehristopher Lundquist, MD;  Location: [facility] ORS;  Service: General;  Laterality: N/A;  . TUBAL LIGATION Bilateral 12/28/2017   Procedure: POST PARTUM TUBAL LIGATION;  Surgeon: Conard NovakJackson, Bilaal Leib D, MD;  Location: [facility] ORS;  Service: Gynecology;  Laterality: Bilateral;    Gynecologic History: No LMP recorded.  Obstetric History: G2P1001  Family History  Problem Relation Age of Onset  . Thyroid disease Mother   . Hypothyroidism Mother   . Hypertension Paternal Grandmother   . Heart disease Paternal Grandmother   . Diabetes Paternal Grandmother   . Kidney disease Paternal Grandmother   . Gallbladder disease Neg Hx   . Breast cancer Neg Hx     Social History   Socioeconomic History  . Marital status: Married    Spouse name: Not on file  . Number of children: 1  . Years of education: Not on file  . Highest education level: Not on file  Occupational  History  . Occupation: Teacher, adult educationN    Employer: [employer]    Comment: Works in ED  Social Needs  . Financial resource strain: Not on file  . Food insecurity:    Worry: Not on file    Inability: Not on file  . Transportation needs:    Medical: Not on file    Non-medical: Not on file  Tobacco Use  . Smoking status: Never Smoker  . Smokeless tobacco: Never Used  Substance and Sexual Activity  . Alcohol use: Yes  . Drug use: No  . Sexual activity: Yes    Birth control/protection: IUD  Lifestyle  . Physical activity:    Days per week: Not on file    Minutes per session: Not on file  . Stress: Not on file  Relationships  . Social connections:    Talks on phone: Not on file    Gets together: Not on file    Attends religious service: Not on file    Active member of club or organization: Not on file    Attends meetings of clubs or organizations: Not on file    Relationship status: Not on file  . Intimate partner violence:    Fear of current or ex partner: Not on file    Emotionally abused: Not on file    Physically abused: Not on file    Forced sexual activity: Not on file  Other Topics Concern  . Not on file  Social History Narrative  . Not on file  Allergies  Allergen Reactions  . Tramadol Anaphylaxis and Rash    Prior to Admission medications   Medication Sig Start Date End Date Taking? Authorizing Provider  ferrous sulfate 325 (65 FE) MG tablet Take 1 tablet (325 mg total) by mouth 2 (two) times daily with a meal. 12/29/17  Yes Conard NovakJackson, Faelyn Sigler D, MD  HYDROcodone-acetaminophen (NORCO/VICODIN) 5-325 MG tablet Take 1 tablet by mouth every 6 (six) hours as needed (breakthrough pain). 12/29/17  Yes Conard NovakJackson, Catalino Plascencia D, MD  ibuprofen (ADVIL,MOTRIN) 600 MG tablet Take 1 tablet (600 mg total) by mouth every 6 (six) hours as needed for mild pain, moderate pain or cramping. 12/29/17  Yes Conard NovakJackson, Marliss Buttacavoli D, MD  Prenatal Vit-Fe Fumarate-FA (MULTIVITAMIN-PRENATAL) 27-0.8 MG TABS  tablet Take 1 tablet by mouth daily at 12 noon.   Yes [provider]    Review of Systems  Constitutional: Negative.   HENT: Negative.   Eyes: Negative.   Respiratory: Negative.   Cardiovascular: Negative.   Gastrointestinal: Positive for abdominal pain (mild soreness at incision site). Negative for blood in stool, constipation, diarrhea, heartburn, melena, nausea and vomiting.  Genitourinary: Negative.   Musculoskeletal: Negative.   Skin: Negative.   Neurological: Negative.   Psychiatric/Behavioral: Negative.      Physical Exam BP 118/72 (BP Location: Left Arm, Patient Position: Sitting)   Pulse 67   Ht 5' (1.524 m)   Wt 168 lb (76.2 kg)   SpO2 99%   BMI 32.81 kg/m  No LMP recorded. Physical Exam  Constitutional: She is oriented to person, place, and time. She appears well-developed and well-nourished. No distress.  HENT:  Head: Normocephalic and atraumatic.  Eyes: Conjunctivae are normal. No scleral icterus.  Pulmonary/Chest: Effort normal and breath sounds normal.  Abdominal: Soft. Bowel sounds are normal. She exhibits no distension. There is no tenderness. There is no guarding.  Incision: clean, dry, intact without erythema, induration, warmth, and tenderness.   Uterine fundus at U-3  Musculoskeletal: Normal range of motion. She exhibits no edema.  Neurological: She is alert and oriented to person, place, and time. No cranial nerve deficit.  Skin: Skin is warm and dry. No erythema.  Psychiatric: She has a normal mood and affect. Her behavior is normal. Judgment normal.    Female chaperone present for pelvic and breast  portions of the physical exam  Assessment: 27 y.o. 852P1001 female here for  1. Preeclampsia in postpartum period   2. Iron deficiency anemia secondary to inadequate dietary iron intake      Plan: Problem List Items Addressed This Visit      Cardiovascular and Mediastinum   Preeclampsia in postpartum period - Primary     Other   Anemia      Precautions given for anemia, as described in the symptoms in the HPI. Precautions given for symptoms of preeclampsia as described in the HPI.  Return in about 5 weeks (around 02/07/2018) for Six Week Postpartum.   Thomasene MohairStephen Sean Malinowski, MD 01/03/2018 5:18 PM

## 2018-01-14 ENCOUNTER — Inpatient Hospital Stay: Admit: 2018-01-14 | Payer: Self-pay

## 2018-01-31 ENCOUNTER — Other Ambulatory Visit (HOSPITAL_COMMUNITY)
Admission: RE | Admit: 2018-01-31 | Discharge: 2018-01-31 | Disposition: A | Discharge status: Home / Self Care | Payer: BC Managed Care – PPO | Source: Ambulatory Visit | Attending: Obstetrics and Gynecology | Admitting: Obstetrics and Gynecology

## 2018-01-31 ENCOUNTER — Ambulatory Visit (INDEPENDENT_AMBULATORY_CARE_PROVIDER_SITE_OTHER): Payer: BC Managed Care – PPO | Admitting: Obstetrics and Gynecology

## 2018-01-31 ENCOUNTER — Encounter: Payer: Self-pay | Admitting: Obstetrics and Gynecology

## 2018-01-31 DIAGNOSIS — Z124 Encounter for screening for malignant neoplasm of cervix: Secondary | ICD-10-CM | POA: Diagnosis present

## 2018-01-31 NOTE — Progress Notes (Signed)
Postpartum Visit   Chief Complaint  Patient presents with  . 6 week post partum   History of Present Illness: Patient is a 27 y.o. G2P1001 presents for postpartum visit.  Date of delivery: 12/27/2017 Type of delivery: Twin vaginal delivery - Vacuum or forceps assisted  no Episiotomy No.  Laceration: no Pregnancy or labor problems:  no Any problems since the delivery:  no  Newborn Details:  SINGLETON :  1. Baby's name: Charlee and Casen. Birth weight: 5.8 lbs and 6.1 lbs Maternal Details:  Breast Feeding:  no Post partum depression/anxiety noted:  preeclampsia Edinburgh Post-Partum Depression Score:  1  Date of last PAP: 08/25/2014  NORMAL   Past Medical History:  Diagnosis Date  . Acute gallstone pancreatitis   . Cholelithiases 01/16/2015  . Pancreatitis, acute 01/16/2015    Past Surgical History:  Procedure Laterality Date  . CHOLECYSTECTOMY N/A 01/19/2015   Procedure: LAPAROSCOPIC CHOLECYSTECTOMY WITH INTRAOPERATIVE CHOLANGIOGRAM;  Surgeon: Ida Rogue, MD;  Location: [facility];  Service: General;  Laterality: N/A;  . TUBAL LIGATION Bilateral 12/28/2017   Procedure: POST PARTUM TUBAL LIGATION;  Surgeon: Conard Novak, MD;  Location: [facility] OR[facility];  Service: Gynecology;  Laterality: Bilateral;    Prior to Admission medications   Medication Sig Start Date End Date Taking? Authorizing Provider  ferrous sulfate 325 (65 FE) MG tablet Take 1 tablet (325 mg total) by mouth 2 (two) times daily with a meal. Patient not taking: Reported on 01/31/2018 12/29/17   Conard Novak, MD  HYDROcodone-acetaminophen (NORCO/VICODIN) 5-325 MG tablet Take 1 tablet by mouth every 6 (six) hours as needed (breakthrough pain). Patient not taking: Reported on 01/31/2018 12/29/17   Conard Novak, MD  ibuprofen (ADVIL,MOTRIN) 600 MG tablet Take 1 tablet (600 mg total) by mouth every 6 (six) hours as needed for mild pain, moderate pain or cramping. Patient not taking: Reported on 01/31/2018  12/29/17   Conard Novak, MD  Prenatal Vit-Fe Fumarate-FA (MULTIVITAMIN-PRENATAL) 27-0.8 MG TABS tablet Take 1 tablet by mouth daily at 12 noon.    [provider]    Allergies  Allergen Reactions  . Tramadol Anaphylaxis and Rash     Social History   Socioeconomic History  . Marital status: Married    Spouse name: Not on file  . Number of children: 1  . Years of education: Not on file  . Highest education level: Not on file  Occupational History  . 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 Financial resource strain: Not on file  . Food insecurity:    Worry: Not on file    Inability: Not on file  . Transportation needs:    Medical: Not on file    Non-medical: Not on file  Tobacco Use  . Smoking status: Never Smoker  . Smokeless tobacco: Never Used  Substance and Sexual Activity  . Alcohol use: Yes  . Drug use: No  . Sexual activity: Yes    Birth control/protection: Surgical  Lifestyle  . Physical activity:    Days per week: Not on file    Minutes per session: Not on file  . Stress: Not on file  Relationships  . Social connections:    Talks on phone: Not on file    Gets together: Not on file    Attends religious service: Not on file    Active member of club or organization: Not on file  Attends meetings of clubs or organizations: Not on file    Relationship status: Not on file  . Intimate partner violence:    Fear of current or ex partner: Not on file    Emotionally abused: Not on file    Physically abused: Not on file    Forced sexual activity: Not on file  Other Topics Concern  . Not on file  Social History Narrative  . Not on file    Family History  Problem Relation Age of Onset  . Thyroid disease Mother   . Hypothyroidism Mother   . Hypertension Paternal Grandmother   . Heart disease Paternal Grandmother   . Diabetes Paternal Grandmother   . Kidney disease Paternal Grandmother   .  Gallbladder disease Neg Hx   . Breast cancer Neg Hx     Review of Systems  Constitutional: Negative.   HENT: Negative.   Eyes: Negative.   Respiratory: Negative.   Cardiovascular: Negative.   Gastrointestinal: Negative.   Genitourinary: Negative.   Musculoskeletal: Negative.   Skin: Negative.   Neurological: Negative.   Psychiatric/Behavioral: Negative.      Physical Exam BP 118/74   Ht 5' (1.524 m)   Wt 157 lb (71.2 kg)   BMI 30.66 kg/m   Physical Exam  Constitutional: She is oriented to person, place, and time. She appears well-developed and well-nourished. No distress.  Genitourinary: Vagina normal and uterus normal. Pelvic exam was performed with patient supine. There is no rash, tenderness or lesion on the right labia. There is no rash, tenderness or lesion on the left labia. Right adnexum does not display mass, does not display tenderness and does not display fullness. Left adnexum does not display mass, does not display tenderness and does not display fullness. Cervix does not exhibit motion tenderness, lesion, polyp or visible IUD strings.   Uterus is mobile.  HENT:  Head: Normocephalic.  Eyes: EOM are normal. No scleral icterus.  Neck: Normal range of motion. Neck supple.  Cardiovascular: Normal rate and regular rhythm.  Pulmonary/Chest: Effort normal and breath sounds normal. No respiratory distress. She has no wheezes. She has no rales.  Abdominal: Soft. Bowel sounds are normal. She exhibits no distension and no mass. There is no tenderness. There is no rebound and no guarding.    Musculoskeletal: Normal range of motion. She exhibits no edema.  Neurological: She is alert and oriented to person, place, and time. No cranial nerve deficit.  Skin: Skin is warm and dry. No erythema.  Psychiatric: She has a normal mood and affect. Her behavior is normal. Judgment normal.     Female Chaperone present during breast and/or pelvic exam.  Assessment: 27 y.o. G2P1001  presenting for 6 week postpartum visit  Plan: Problem List Items Addressed This Visit    None    Visit Diagnoses    Postpartum care and examination    -  Primary   Pap smear for cervical cancer screening       Relevant Orders   Cytology - PAP     1) Contraception: s/p BTL.  2)  Pap - ASCCP guidelines and rational discussed.  Patient opts for routine screening interval. Pap smear collected today.  3) Patient underwent screening for postpartum depression with no concerns noted.  4) Follow up 1 year for routine annual exam  Thomasene MohairStephen Nilan Iddings, MD 01/31/2018 10:44 AM

## 2018-02-03 LAB — CYTOLOGY - PAP: DIAGNOSIS: NEGATIVE

## 2019-12-22 LAB — HM PAP SMEAR: HM Pap smear: NEGATIVE

## 2019-12-24 LAB — HM PAP SMEAR: HM Pap smear: NEGATIVE

## 2021-06-20 DIAGNOSIS — Z131 Encounter for screening for diabetes mellitus: Secondary | ICD-10-CM | POA: Diagnosis not present

## 2021-06-20 DIAGNOSIS — Z01419 Encounter for gynecological examination (general) (routine) without abnormal findings: Secondary | ICD-10-CM | POA: Diagnosis not present

## 2021-06-20 DIAGNOSIS — Z13220 Encounter for screening for lipoid disorders: Secondary | ICD-10-CM | POA: Diagnosis not present

## 2021-06-20 DIAGNOSIS — Z1321 Encounter for screening for nutritional disorder: Secondary | ICD-10-CM | POA: Diagnosis not present

## 2021-06-20 DIAGNOSIS — Z1329 Encounter for screening for other suspected endocrine disorder: Secondary | ICD-10-CM | POA: Diagnosis not present

## 2021-06-20 DIAGNOSIS — Z130 Encounter for screening for diseases of the blood and blood-forming organs and certain disorders involving the immune mechanism: Secondary | ICD-10-CM | POA: Diagnosis not present

## 2022-06-21 DIAGNOSIS — N920 Excessive and frequent menstruation with regular cycle: Secondary | ICD-10-CM | POA: Diagnosis not present

## 2022-06-21 DIAGNOSIS — Z01419 Encounter for gynecological examination (general) (routine) without abnormal findings: Secondary | ICD-10-CM | POA: Diagnosis not present

## 2022-06-21 DIAGNOSIS — R234 Changes in skin texture: Secondary | ICD-10-CM | POA: Diagnosis not present

## 2022-07-05 DIAGNOSIS — R234 Changes in skin texture: Secondary | ICD-10-CM | POA: Diagnosis not present

## 2022-07-05 DIAGNOSIS — N644 Mastodynia: Secondary | ICD-10-CM | POA: Diagnosis not present

## 2022-10-10 NOTE — Progress Notes (Unsigned)
   There were no vitals taken for this visit.   Subjective:    Patient ID: Kayla Parrish, female    DOB: 05-22-1990, 32 y.o.   MRN: [id number]  HPI: Kayla Parrish is a 32 y.o. female  No chief complaint on file.  Establish care: her last physical was ***.  Medical history includes ***.  Family history includes ***.  Health maintenance ***.   Relevant past medical, surgical, family and social history reviewed and updated as indicated. Interim medical history since our last visit reviewed. Allergies and medications reviewed and updated.  Review of Systems  Constitutional: Negative for fever or weight change.  Respiratory: Negative for cough and shortness of breath.   Cardiovascular: Negative for chest pain or palpitations.  Gastrointestinal: Negative for abdominal pain, no bowel changes.  Musculoskeletal: Negative for gait problem or joint swelling.  Skin: Negative for rash.  Neurological: Negative for dizziness or headache.  No other specific complaints in a complete review of systems (except as listed in HPI above).      Objective:    There were no vitals taken for this visit.  Wt Readings from Last 3 Encounters:  01/31/18 157 lb (71.2 kg)  01/03/18 168 lb (76.2 kg)  12/26/17 193 lb (87.5 kg)    Physical Exam  Constitutional: Patient appears well-developed and well-nourished. Obese *** No distress.  HEENT: head atraumatic, normocephalic, pupils equal and reactive to light, ears ***, neck supple, throat within normal limits Cardiovascular: Normal rate, regular rhythm and normal heart sounds.  No murmur heard. No BLE edema. Pulmonary/Chest: Effort normal and breath sounds normal. No respiratory distress. Abdominal: Soft.  There is no tenderness. Psychiatric: Patient has a normal mood and affect. behavior is normal. Judgment and thought content normal.  Results for orders placed or performed in visit on 01/31/18  Cytology - PAP  Result Value Ref Range   Adequacy       Satisfactory for evaluation  endocervical/transformation zone component PRESENT.   Diagnosis      NEGATIVE FOR INTRAEPITHELIAL LESIONS OR MALIGNANCY.   Material Submitted CervicoVaginal Pap [ThinPrep Imaged]       Assessment & Plan:   Problem List Items Addressed This Visit   None    Follow up plan: No follow-ups on file.

## 2022-10-11 ENCOUNTER — Encounter: Payer: Self-pay | Admitting: Nurse Practitioner

## 2022-10-11 ENCOUNTER — Other Ambulatory Visit: Payer: Self-pay

## 2022-10-11 ENCOUNTER — Ambulatory Visit: Payer: Commercial Managed Care - PPO | Admitting: Nurse Practitioner

## 2022-10-11 VITALS — BP 112/68 | HR 88 | Temp 97.8°F | Resp 18 | Ht 60.0 in | Wt 188.4 lb

## 2022-10-11 DIAGNOSIS — Z8349 Family history of other endocrine, nutritional and metabolic diseases: Secondary | ICD-10-CM

## 2022-10-11 DIAGNOSIS — Z7689 Persons encountering health services in other specified circumstances: Secondary | ICD-10-CM | POA: Diagnosis not present

## 2022-10-11 DIAGNOSIS — Z13220 Encounter for screening for lipoid disorders: Secondary | ICD-10-CM

## 2022-10-11 DIAGNOSIS — E6609 Other obesity due to excess calories: Secondary | ICD-10-CM | POA: Insufficient documentation

## 2022-10-11 DIAGNOSIS — Z130 Encounter for screening for diseases of the blood and blood-forming organs and certain disorders involving the immune mechanism: Secondary | ICD-10-CM | POA: Diagnosis not present

## 2022-10-11 DIAGNOSIS — Z1159 Encounter for screening for other viral diseases: Secondary | ICD-10-CM | POA: Diagnosis not present

## 2022-10-11 DIAGNOSIS — R591 Generalized enlarged lymph nodes: Secondary | ICD-10-CM | POA: Diagnosis not present

## 2022-10-11 DIAGNOSIS — Z114 Encounter for screening for human immunodeficiency virus [HIV]: Secondary | ICD-10-CM

## 2022-10-11 DIAGNOSIS — Z131 Encounter for screening for diabetes mellitus: Secondary | ICD-10-CM

## 2022-10-11 DIAGNOSIS — E66812 Obesity, class 2: Secondary | ICD-10-CM

## 2022-10-11 DIAGNOSIS — Z6836 Body mass index (BMI) 36.0-36.9, adult: Secondary | ICD-10-CM | POA: Diagnosis not present

## 2022-10-11 LAB — CBC WITH DIFFERENTIAL/PLATELET
Basophils Relative: 1.1 %
MCHC: 32.4 g/dL (ref 32.0–36.0)
RBC: 4.52 10*6/uL (ref 3.80–5.10)
RDW: 11.9 % (ref 11.0–15.0)

## 2022-10-11 MED ORDER — PHENTERMINE HCL 37.5 MG PO TABS
37.5000 mg | ORAL_TABLET | Freq: Every day | ORAL | 2 refills | Status: DC
Start: 2022-10-11 — End: 2023-01-11
  Filled 2022-10-11: qty 30, 30d supply, fill #0
  Filled 2022-11-05: qty 30, 30d supply, fill #1
  Filled 2022-12-09: qty 30, 30d supply, fill #2

## 2022-10-11 NOTE — Assessment & Plan Note (Signed)
Getting tsh 

## 2022-10-11 NOTE — Assessment & Plan Note (Signed)
Getting labs, start phentermine, take 1/2 tab for the first three days then increase to 1 tab daily.  Eat well balanced diet with portion control and increase physical activity as tolerated.

## 2022-10-12 LAB — COMPLETE METABOLIC PANEL WITH GFR
AST: 15 U/L (ref 10–30)
Alkaline phosphatase (APISO): 68 U/L (ref 31–125)
Chloride: 105 mmol/L (ref 98–110)
Sodium: 141 mmol/L (ref 135–146)
Total Protein: 7.6 g/dL (ref 6.1–8.1)

## 2022-10-12 LAB — LIPID PANEL
HDL: 47 mg/dL — ABNORMAL LOW (ref >=50)
Total CHOL/HDL Ratio: 3.3 (calc) (ref <5.0)
Triglycerides: 86 mg/dL (ref <150)

## 2022-10-12 LAB — CBC WITH DIFFERENTIAL/PLATELET
Eosinophils Absolute: 111 cells/uL (ref 15–500)
HCT: 38.9 % (ref 35.0–45.0)
Monocytes Relative: 8 %

## 2022-10-13 LAB — CBC WITH DIFFERENTIAL/PLATELET
Absolute Monocytes: 632 cells/uL (ref 200–950)
Basophils Absolute: 87 cells/uL (ref 0–200)
Eosinophils Relative: 1.4 %
Hemoglobin: 12.6 g/dL (ref 11.7–15.5)
Lymphs Abs: 2196 cells/uL (ref 850–3900)
MCH: 27.9 pg (ref 27.0–33.0)
MCV: 86.1 fL (ref 80.0–100.0)
MPV: 10.9 fL (ref 7.5–12.5)
Neutro Abs: 4874 cells/uL (ref 1500–7800)
Neutrophils Relative %: 61.7 %
Platelets: 374 10*3/uL (ref 140–400)
Total Lymphocyte: 27.8 %
WBC: 7.9 10*3/uL (ref 3.8–10.8)

## 2022-10-13 LAB — LIPID PANEL
Cholesterol: 157 mg/dL (ref <200)
LDL Cholesterol (Calc): 92 mg/dL (calc)
Non-HDL Cholesterol (Calc): 110 mg/dL (calc) (ref <130)

## 2022-10-13 LAB — COMPLETE METABOLIC PANEL WITH GFR
AG Ratio: 1.2 (calc) (ref 1.0–2.5)
ALT: 13 U/L (ref 6–29)
Albumin: 4.1 g/dL (ref 3.6–5.1)
BUN: 9 mg/dL (ref 7–25)
CO2: 25 mmol/L (ref 20–32)
Calcium: 9.1 mg/dL (ref 8.6–10.2)
Creat: 0.77 mg/dL (ref 0.50–0.97)
Globulin: 3.5 g/dL (calc) (ref 1.9–3.7)
Glucose, Bld: 81 mg/dL (ref 65–99)
Potassium: 4 mmol/L (ref 3.5–5.3)
Total Bilirubin: 0.6 mg/dL (ref 0.2–1.2)
eGFR: 104 mL/min/{1.73_m2} (ref >=60)

## 2022-10-13 LAB — HIV ANTIBODY (ROUTINE TESTING W REFLEX): HIV 1&2 Ab, 4th Generation: NONREACTIVE

## 2022-10-13 LAB — C-REACTIVE PROTEIN: CRP: 5.9 mg/L

## 2022-10-13 LAB — SEDIMENTATION RATE: Sed Rate: 11 mm/h (ref 0–20)

## 2022-10-13 LAB — TSH: TSH: 1.74 mIU/L

## 2022-10-13 LAB — HEMOGLOBIN A1C
Hgb A1c MFr Bld: 5.1 % of total Hgb (ref <5.7)
Mean Plasma Glucose: 100 mg/dL
eAG (mmol/L): 5.5 mmol/L

## 2022-10-13 LAB — HEPATITIS C ANTIBODY: Hepatitis C Ab: NONREACTIVE

## 2022-10-19 ENCOUNTER — Ambulatory Visit
Admission: RE | Admit: 2022-10-19 | Discharge: 2022-10-19 | Disposition: A | Discharge status: Home / Self Care | Payer: Commercial Managed Care - PPO | Source: Ambulatory Visit | Attending: Nurse Practitioner | Admitting: Nurse Practitioner

## 2022-10-19 ENCOUNTER — Other Ambulatory Visit: Payer: Self-pay | Admitting: Nurse Practitioner

## 2022-10-19 DIAGNOSIS — R591 Generalized enlarged lymph nodes: Secondary | ICD-10-CM

## 2022-10-19 DIAGNOSIS — R221 Localized swelling, mass and lump, neck: Secondary | ICD-10-CM | POA: Diagnosis not present

## 2022-10-23 ENCOUNTER — Ambulatory Visit
Admission: RE | Admit: 2022-10-23 | Discharge: 2022-10-23 | Disposition: A | Discharge status: Home / Self Care | Payer: Commercial Managed Care - PPO | Source: Ambulatory Visit | Attending: Nurse Practitioner | Admitting: Nurse Practitioner

## 2022-10-23 DIAGNOSIS — R591 Generalized enlarged lymph nodes: Secondary | ICD-10-CM | POA: Diagnosis not present

## 2022-10-23 DIAGNOSIS — R590 Localized enlarged lymph nodes: Secondary | ICD-10-CM | POA: Diagnosis not present

## 2022-10-23 MED ORDER — IOHEXOL 300 MG/ML  SOLN
75.0000 mL | Freq: Once | INTRAMUSCULAR | Status: AC | PRN
  Administered 2022-10-23: 75 mL via INTRAVENOUS

## 2022-10-31 ENCOUNTER — Encounter: Payer: Self-pay | Admitting: Nurse Practitioner

## 2022-11-04 ENCOUNTER — Encounter: Payer: Self-pay | Admitting: Nurse Practitioner

## 2022-11-05 ENCOUNTER — Other Ambulatory Visit: Payer: Self-pay

## 2022-11-20 DIAGNOSIS — D37030 Neoplasm of uncertain behavior of the parotid salivary glands: Secondary | ICD-10-CM | POA: Diagnosis not present

## 2022-11-20 DIAGNOSIS — E041 Nontoxic single thyroid nodule: Secondary | ICD-10-CM | POA: Diagnosis not present

## 2022-11-29 ENCOUNTER — Other Ambulatory Visit: Payer: Self-pay | Admitting: Otolaryngology

## 2022-11-29 DIAGNOSIS — E079 Disorder of thyroid, unspecified: Secondary | ICD-10-CM

## 2022-11-29 DIAGNOSIS — K119 Disease of salivary gland, unspecified: Secondary | ICD-10-CM

## 2022-11-30 NOTE — Progress Notes (Signed)
Oley Balm, MD sent to [facility] PROCEDURE / BIOPSY REVIEW Date: 11/28/22  Requested Biopsy site: L parotid lesion Reason for request: r/o cancer Imaging review: Best seen on CT 10/23/22  Decision: Approved Imaging modality to perform: Ultrasound Schedule with: Patient preference (Local vs Mod Sed) Schedule for: Any VIR  Additional comments:   Please contact me with questions, concerns, or if issue pertaining to this request arise.  Dayne Oley Balm, MD [facility]

## 2022-12-05 ENCOUNTER — Ambulatory Visit
Admission: RE | Admit: 2022-12-05 | Discharge: 2022-12-05 | Disposition: A | Discharge status: Home / Self Care | Payer: Commercial Managed Care - PPO | Source: Ambulatory Visit | Attending: Otolaryngology | Admitting: Otolaryngology

## 2022-12-05 DIAGNOSIS — E079 Disorder of thyroid, unspecified: Secondary | ICD-10-CM | POA: Insufficient documentation

## 2022-12-05 DIAGNOSIS — K118 Other diseases of salivary glands: Secondary | ICD-10-CM | POA: Diagnosis not present

## 2022-12-05 DIAGNOSIS — E041 Nontoxic single thyroid nodule: Secondary | ICD-10-CM | POA: Diagnosis not present

## 2022-12-07 NOTE — Progress Notes (Signed)
Patient for US guided core LT Parotid lesion biopsy on Monday 12/10/2022, I called and spoke with the patient on the phone and gave pre-procedure instructions. Pt was made aware to be here at 12:30p and check in at the [facility]. Pt stated understanding.  Called 12/07/2022

## 2022-12-10 ENCOUNTER — Ambulatory Visit
Admission: RE | Admit: 2022-12-10 | Discharge: 2022-12-10 | Disposition: A | Discharge status: Home / Self Care | Payer: Commercial Managed Care - PPO | Source: Ambulatory Visit | Attending: Otolaryngology | Admitting: Otolaryngology

## 2022-12-10 ENCOUNTER — Other Ambulatory Visit: Payer: Self-pay

## 2022-12-10 DIAGNOSIS — K118 Other diseases of salivary glands: Secondary | ICD-10-CM | POA: Diagnosis not present

## 2022-12-10 DIAGNOSIS — K119 Disease of salivary gland, unspecified: Secondary | ICD-10-CM | POA: Insufficient documentation

## 2022-12-10 MED ORDER — LIDOCAINE HCL (PF) 1 % IJ SOLN
5.0000 mL | Freq: Once | INTRAMUSCULAR | Status: AC
  Administered 2022-12-10: 5 mL via INTRADERMAL
  Filled 2022-12-10: qty 5

## 2022-12-10 NOTE — Procedures (Signed)
Interventional Radiology Procedure Note  Procedure: [place] CORE BX LEFT PAROTID MASS    Complications: None  Estimated Blood Loss:  MIN  Findings: 18 G CORES ON SALINE TELFA    Sharen Counter, MD

## 2022-12-31 ENCOUNTER — Other Ambulatory Visit: Payer: Self-pay

## 2022-12-31 ENCOUNTER — Ambulatory Visit: Payer: Commercial Managed Care - PPO | Admitting: Nurse Practitioner

## 2022-12-31 ENCOUNTER — Encounter: Payer: Self-pay | Admitting: Nurse Practitioner

## 2022-12-31 VITALS — BP 122/78 | HR 100 | Temp 98.9°F | Resp 18 | Ht 60.0 in | Wt 176.9 lb

## 2022-12-31 DIAGNOSIS — D110 Benign neoplasm of parotid gland: Secondary | ICD-10-CM | POA: Diagnosis not present

## 2022-12-31 DIAGNOSIS — E041 Nontoxic single thyroid nodule: Secondary | ICD-10-CM | POA: Diagnosis not present

## 2022-12-31 DIAGNOSIS — R110 Nausea: Secondary | ICD-10-CM

## 2022-12-31 DIAGNOSIS — R350 Frequency of micturition: Secondary | ICD-10-CM

## 2022-12-31 LAB — POCT URINALYSIS DIPSTICK
Bilirubin, UA: NEGATIVE
Glucose, UA: NEGATIVE
Ketones, UA: NEGATIVE
Protein, UA: POSITIVE — AB
Spec Grav, UA: 1.02 (ref 1.010–1.025)
Urobilinogen, UA: 0.2 E.U./dL
pH, UA: 6 (ref 5.0–8.0)

## 2022-12-31 MED ORDER — ONDANSETRON 4 MG PO TBDP
4.0000 mg | ORAL_TABLET | Freq: Three times a day (TID) | ORAL | 0 refills | Status: DC | PRN
Start: 2022-12-31 — End: 2023-04-15
  Filled 2022-12-31: qty 30, 10d supply, fill #0

## 2022-12-31 MED ORDER — CEPHALEXIN 500 MG PO CAPS
500.0000 mg | ORAL_CAPSULE | Freq: Two times a day (BID) | ORAL | 0 refills | Status: AC
Start: 2022-12-31 — End: 2023-01-05
  Filled 2022-12-31: qty 10, 5d supply, fill #0

## 2022-12-31 NOTE — Progress Notes (Signed)
BP 122/78   Pulse 100   Temp 98.9 F (37.2 C) (Oral)   Resp 18   Ht 5' (1.524 m)   Wt 176 lb 14.4 oz (80.2 kg)   LMP 12/14/2022   SpO2 99%   BMI 34.55 kg/m    Subjective:    Patient ID: Kayla Parrish, female    DOB: 08-22-1990, 32 y.o.   MRN: [id number]  HPI: Kayla Parrish is a 33 y.o. female  Chief Complaint  Patient presents with   Urinary Frequency    Pressure and burning when urinating   URINARY SYMPTOMS Started Friday  Dysuria: yes Urinary frequency: yes Urgency: yes Small volume voids: no Symptom severity:  moderate Urinary incontinence: no Foul odor: yes Hematuria: no Abdominal pain: yes Back pain: yes Suprapubic pain/pressure: yes Flank pain: no Fever:  subjective Vomiting: no Relief with cranberry juice: no Relief with pyridium: no Status: better Previous urinary tract infection: no Recurrent urinary tract infection: no Sexual activity: practicing safe sex History of sexually transmitted disease: no Treatments attempted: tylenol, ibuprofen Urine dip positive for blood, protein, nitrites, leukocytes, will send urine for culture She reports that antibiotics can make her nauseated, will send in some zofran.   Relevant past medical, surgical, family and social history reviewed and updated as indicated. Interim medical history since our last visit reviewed. Allergies and medications reviewed and updated.  Review of Systems  Constitutional: Negative for fever or weight change.  Respiratory: Negative for cough and shortness of breath.   Cardiovascular: Negative for chest pain or palpitations.  Gastrointestinal: Negative for abdominal pain, no bowel changes.  GU: positive for urinary frequency and dysuria Musculoskeletal: Negative for gait problem or joint swelling.  Skin: Negative for rash.  Neurological: Negative for dizziness or headache.  No other specific complaints in a complete review of systems (except as listed in HPI above).       Objective:    BP 122/78   Pulse 100   Temp 98.9 F (37.2 C) (Oral)   Resp 18   Ht 5' (1.524 m)   Wt 176 lb 14.4 oz (80.2 kg)   LMP 12/14/2022   SpO2 99%   BMI 34.55 kg/m   Wt Readings from Last 3 Encounters:  12/31/22 176 lb 14.4 oz (80.2 kg)  10/11/22 188 lb 6.4 oz (85.5 kg)  01/31/18 157 lb (71.2 kg)    Physical Exam  Constitutional: Patient appears well-developed and well-nourished. Obese  No distress.  HEENT: head atraumatic, normocephalic, pupils equal and reactive to light, neck supple Cardiovascular: Normal rate, regular rhythm and normal heart sounds.  No murmur heard. No BLE edema. Pulmonary/Chest: Effort normal and breath sounds normal. No respiratory distress. Abdominal: Soft.  There is no tenderness. No CVA tenderness Psychiatric: Patient has a normal mood and affect. behavior is normal. Judgment and thought content normal.   Results for orders placed or performed in visit on 12/31/22  POCT urinalysis dipstick  Result Value Ref Range   Color, UA gold    Clarity, UA cloudy    Glucose, UA Negative Negative   Bilirubin, UA negative    Ketones, UA negative    Spec Grav, UA 1.020 1.010 - 1.025   Blood, UA large    pH, UA 6.0 5.0 - 8.0   Protein, UA Positive (A) Negative   Urobilinogen, UA 0.2 0.2 or 1.0 E.U./dL   Nitrite, UA small    Leukocytes, UA Large (3+) (A) Negative   Appearance cloudy  Odor none       Assessment & Plan:   Problem List Items Addressed This Visit   None Visit Diagnoses     Urinary frequency    -  Primary   urine sent for culture, start keflex, zofran for nausea, push fluids   Relevant Medications   cephALEXin (KEFLEX) 500 MG capsule   Other Relevant Orders   POCT urinalysis dipstick (Completed)   Urine Culture   Nausea       zofran for nausea   Relevant Medications   ondansetron (ZOFRAN-ODT) 4 MG disintegrating tablet        Follow up plan: Return if symptoms worsen or fail to improve.

## 2023-01-02 ENCOUNTER — Encounter: Payer: Self-pay | Admitting: Nurse Practitioner

## 2023-01-10 NOTE — Progress Notes (Signed)
BP 126/72   Pulse 98   Temp 97.8 F (36.6 C) (Oral)   Resp 16   Ht 5' (1.524 m)   Wt 173 lb 9.6 oz (78.7 kg)   LMP 12/26/2022   SpO2 99%   BMI 33.90 kg/m    Subjective:    Patient ID: Kayla Parrish, female    DOB: 1990-10-31, 32 y.o.   MRN: [id number]  HPI: Kayla Parrish is a 32 y.o. female  Chief Complaint  Patient presents with   Obesity    Follow up weight check, medication   Lymphadenopathy: last visit noted lymphadenopathy on the left side of her neck.  Ordered an ultrasound, which showed: Patient's palpable area of concern involving the left side of the face/neck correlates with an enlarged lymph node either within or immediately adjacent to the left parotid gland - if not previously performed, ENT consultation is advised.While potentially reactive in etiology, given reported persistence of this nodule for the past year, further evaluation with contrast-enhanced neck CT could be performed as indicated. Ct scan was ordered and showed : 1. 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 Tissue sampling is recommended. 2. Possible 1.5 cm hypoenhancing lesion in the right thyroid lobe. If this has not previously been evaluated, a non-emergent ultrasound of the thyroid is recommended. (Reference: J Am Coll Radiol. 2015 Feb;12(2): 143-50) 3. Prominent upper cervical lymph nodes, favored to be reactive. No abnormal density lymph nodes.  Referral had been placed to ent.  Patient saw Dr. Monia Pouch.  Who did a biopsy and has now scheduled patient for parotidectomy on 02/12/2023.  Obesity:  Current weight : 173 lbs BMI: 33.90 Previous weight:188 lbs Treatment Tried: lifestyle modification, phentermine Comorbidities: none   Relevant past medical, surgical, family and social history reviewed and updated as indicated. Interim medical history since our  last visit reviewed. Allergies and medications reviewed and updated.  Review of Systems  Constitutional: Negative for fever or weight change.  Respiratory: Negative for cough and shortness of breath.   Cardiovascular: Negative for chest pain or palpitations.  Gastrointestinal: Negative for abdominal pain, no bowel changes.  Musculoskeletal: Negative for gait problem or joint swelling.  Skin: Negative for rash.  Neurological: Negative for dizziness or headache.  No other specific complaints in a complete review of systems (except as listed in HPI above).      Objective:    BP 126/72   Pulse 98   Temp 97.8 F (36.6 C) (Oral)   Resp 16   Ht 5' (1.524 m)   Wt 173 lb 9.6 oz (78.7 kg)   LMP 12/26/2022   SpO2 99%   BMI 33.90 kg/m   Wt Readings from Last 3 Encounters:  01/11/23 173 lb 9.6 oz (78.7 kg)  12/31/22 176 lb 14.4 oz (80.2 kg)  10/11/22 188 lb 6.4 oz (85.5 kg)    Physical Exam  Constitutional: Patient appears well-developed and well-nourished. Obese  No distress.  HEENT: head atraumatic, normocephalic, pupils equal and reactive to light, ears TMs clear, neck supple, throat within normal limits. Left side lymphadenopathy  Cardiovascular: Normal rate, regular rhythm and normal heart sounds.  No murmur heard. No BLE edema. Pulmonary/Chest: Effort normal and breath sounds normal. No respiratory distress. Abdominal: Soft.  There is no tenderness. Psychiatric: Patient has a normal mood and affect. behavior is normal. Judgment and thought content normal.  Results  for orders placed or performed in visit on 12/31/22  Urine Culture   Specimen: Urine  Result Value Ref Range   MICRO NUMBER: [id number]    SPECIMEN QUALITY: Adequate    Sample Source URINE    STATUS: FINAL    Result: No Growth   POCT urinalysis dipstick  Result Value Ref Range   Color, UA gold    Clarity, UA cloudy    Glucose, UA Negative Negative   Bilirubin, UA negative    Ketones, UA negative    Spec Grav,  UA 1.020 1.010 - 1.025   Blood, UA large    pH, UA 6.0 5.0 - 8.0   Protein, UA Positive (A) Negative   Urobilinogen, UA 0.2 0.2 or 1.0 E.U./dL   Nitrite, UA small    Leukocytes, UA Large (3+) (A) Negative   Appearance cloudy    Odor none       Assessment & Plan:   Problem List Items Addressed This Visit       Other   Class 2 obesity due to excess calories without serious comorbidity with body mass index (BMI) of 36.0 to 36.9 in adult - Primary    Switch from phentermine to qsymia. Continue life style modification      Relevant Medications   Phentermine-Topiramate (QSYMIA) 3.75-23 MG CP24   Other Visit Diagnoses     Lymphadenopathy       scheduled for surgery to remove tumor off parotid gland         Follow up plan: Return in about 3 months (around 04/13/2023) for follow up.

## 2023-01-11 ENCOUNTER — Ambulatory Visit: Payer: Commercial Managed Care - PPO | Admitting: Nurse Practitioner

## 2023-01-11 ENCOUNTER — Other Ambulatory Visit: Payer: Self-pay

## 2023-01-11 ENCOUNTER — Encounter: Payer: Self-pay | Admitting: Nurse Practitioner

## 2023-01-11 VITALS — BP 126/72 | HR 98 | Temp 97.8°F | Resp 16 | Ht 60.0 in | Wt 173.6 lb

## 2023-01-11 DIAGNOSIS — Z6836 Body mass index (BMI) 36.0-36.9, adult: Secondary | ICD-10-CM | POA: Diagnosis not present

## 2023-01-11 DIAGNOSIS — E6609 Other obesity due to excess calories: Secondary | ICD-10-CM

## 2023-01-11 DIAGNOSIS — N926 Irregular menstruation, unspecified: Secondary | ICD-10-CM | POA: Insufficient documentation

## 2023-01-11 DIAGNOSIS — R591 Generalized enlarged lymph nodes: Secondary | ICD-10-CM | POA: Diagnosis not present

## 2023-01-11 MED ORDER — QSYMIA 3.75-23 MG PO CP24
1.0000 | ORAL_CAPSULE | Freq: Every day | ORAL | 0 refills | Status: AC
Start: 2023-01-11 — End: ?
  Filled 2023-01-11 – 2023-01-22 (×5): qty 30, 30d supply, fill #0

## 2023-01-11 NOTE — Assessment & Plan Note (Signed)
Switch from phentermine to qsymia. Continue life style modification

## 2023-01-14 ENCOUNTER — Other Ambulatory Visit: Payer: Self-pay

## 2023-01-15 ENCOUNTER — Encounter: Payer: Self-pay | Admitting: Nurse Practitioner

## 2023-01-15 ENCOUNTER — Other Ambulatory Visit: Payer: Self-pay

## 2023-01-20 DIAGNOSIS — D110 Benign neoplasm of parotid gland: Secondary | ICD-10-CM

## 2023-01-20 HISTORY — DX: Benign neoplasm of parotid gland: D11.0

## 2023-01-22 ENCOUNTER — Other Ambulatory Visit: Payer: Self-pay

## 2023-01-23 ENCOUNTER — Other Ambulatory Visit: Payer: Self-pay

## 2023-01-23 ENCOUNTER — Encounter: Payer: Self-pay | Admitting: Nurse Practitioner

## 2023-02-01 DIAGNOSIS — D110 Benign neoplasm of parotid gland: Secondary | ICD-10-CM | POA: Diagnosis not present

## 2023-02-06 ENCOUNTER — Encounter
Admission: RE | Admit: 2023-02-06 | Discharge: 2023-02-06 | Disposition: A | Discharge status: Home / Self Care | Payer: Commercial Managed Care - PPO | Source: Ambulatory Visit | Attending: Otolaryngology | Admitting: Otolaryngology

## 2023-02-06 VITALS — Ht 60.0 in | Wt 172.0 lb

## 2023-02-06 DIAGNOSIS — Z01812 Encounter for preprocedural laboratory examination: Secondary | ICD-10-CM

## 2023-02-06 HISTORY — DX: Generalized enlarged lymph nodes: R59.1

## 2023-02-06 HISTORY — DX: Family history of other specified conditions: Z84.89

## 2023-02-06 NOTE — Patient Instructions (Addendum)
Your procedure is scheduled on: Tuesday, September 24 Report to the Registration Desk on the 1st floor of the [facility] To find out your arrival time, please call [phone] between 1PM - 3PM on: Monday, September 23 If your arrival time is 6:00 am, do not arrive before that time as the [facility] entrance doors do not open until 6:00 am.  REMEMBER: Instructions that are not followed completely may result in serious medical risk, up to and including death; or upon the discretion of your surgeon and anesthesiologist your surgery may need to be rescheduled.  Do not eat food after midnight the night before surgery.  No gum chewing or hard candies.  You may however, drink CLEAR liquids up to 2 hours before you are scheduled to arrive for your surgery. Do not drink anything within 2 hours of your scheduled arrival time.  Clear liquids include: - water  - apple juice without pulp - gatorade (not RED colors) - black coffee or tea (Do NOT add milk or creamers to the coffee or tea) Do NOT drink anything that is not on this list.  One week prior to surgery: starting today, September 18 Stop Anti-inflammatories (NSAIDS) such as Advil, Aleve, Ibuprofen, Motrin, Naproxen, Naprosyn and Aspirin based products such as Excedrin, Goody's Powder, BC Powder. Stop ANY OVER THE COUNTER supplements until after surgery. You may however, continue to take Tylenol if needed for pain up until the day of surgery.  Continue taking all prescribed medications with the exception of the following:  Qsymia - hold for 4 days before surgery.  DO NOT TAKE ANY MEDICATIONS THE MORNING OF SURGERY   No Alcohol for 24 hours before or after surgery.  No Smoking including e-cigarettes for 24 hours before surgery.  No chewable tobacco products for at least 6 hours before surgery.  No nicotine patches on the day of surgery.  Do not use any "recreational" drugs for at least a week (preferably 2 weeks) before your  surgery.  Please be advised that the combination of cocaine and anesthesia may have negative outcomes, up to and including death. If you test positive for cocaine, your surgery will be cancelled.  On the morning of surgery brush your teeth with toothpaste and water, you may rinse your mouth with mouthwash if you wish. Do not swallow any toothpaste or mouthwash.  Use CHG Soap as directed on instruction sheet.  Do not wear jewelry, make-up, hairpins, clips or nail polish.  For welded (permanent) jewelry: bracelets, anklets, waist bands, etc.  Please have this removed prior to surgery.  If it is not removed, there is a chance that hospital personnel will need to cut it off on the day of surgery.  Do not wear lotions, powders, or perfumes.   Do not shave body hair from the neck down 48 hours before surgery.  Contact lenses, hearing aids and dentures may not be worn into surgery.  Do not bring valuables to the hospital. [facility] is not responsible for any missing/lost belongings or valuables.   Notify your doctor if there is any change in your medical condition (cold, fever, infection).  Wear comfortable clothing (specific to your surgery type) to the hospital.  After surgery, you can help prevent lung complications by doing breathing exercises.  Take deep breaths and cough every 1-2 hours.   If you are being admitted to the hospital overnight, leave your suitcase in the car. After surgery it may be brought to your room.  In case  of increased patient census, it may be necessary for you, the patient, to continue your postoperative care in the Same Day Surgery department.  If you are being discharged the day of surgery, you will not be allowed to drive home. You will need a responsible individual to drive you home and stay with you for 24 hours after surgery.   If you are taking public transportation, you will need to have a responsible individual with you.  Please call the  Pre-admissions Testing Dept. at [phone] if you have any questions about these instructions.  Surgery Visitation Policy:  Patients having surgery or a procedure may have two visitors.  Children under the age of 29 must have an adult with them who is not the patient.  Inpatient Visitation:    Visiting hours are 7 a.m. to 8 p.m. Up to four visitors are allowed at one time in a patient room. The visitors may rotate out with other people during the day.  One visitor age 1 or older may stay with the patient overnight and must be in the room by 8 p.m.     Preparing for Surgery with CHLORHEXIDINE GLUCONATE (CHG) Soap  Chlorhexidine Gluconate (CHG) Soap  o An antiseptic cleaner that kills germs and bonds with the skin to continue killing germs even after washing  o Used for showering the night before surgery and morning of surgery  Before surgery, you can play an important role by reducing the number of germs on your skin.  CHG (Chlorhexidine gluconate) soap is an antiseptic cleanser which kills germs and bonds with the skin to continue killing germs even after washing.  Please do not use if you have an allergy to CHG or antibacterial soaps. If your skin becomes reddened/irritated stop using the CHG.  1. Shower the NIGHT BEFORE SURGERY and the MORNING OF SURGERY with CHG soap.  2. If you choose to wash your hair, wash your hair first as usual with your normal shampoo.  3. After shampooing, rinse your hair and body thoroughly to remove the shampoo.  4. Use CHG as you would any other liquid soap. You can apply CHG directly to the skin and wash gently with a scrungie or a clean washcloth.  5. Apply the CHG soap to your body only from the neck down. Do not use on open wounds or open sores. Avoid contact with your eyes, ears, mouth, and genitals (private parts). Wash face and genitals (private parts) with your normal soap.  6. Wash thoroughly, paying special attention to the area  where your surgery will be performed.  7. Thoroughly rinse your body with warm water.  8. Do not shower/wash with your normal soap after using and rinsing off the CHG soap.  9. Pat yourself dry with a clean towel.  10. Wear clean pajamas to bed the night before surgery.  12. Place clean sheets on your bed the night of your first shower and do not sleep with pets.  13. Shower again with the CHG soap on the day of surgery prior to arriving at the hospital.  14. Do not apply any deodorants/lotions/powders.  15. Please wear clean clothes to the hospital.

## 2023-02-11 MED ORDER — CHLORHEXIDINE GLUCONATE 0.12 % MT SOLN
15.0000 mL | Freq: Once | OROMUCOSAL | Status: AC
  Administered 2023-02-12: 15 mL via OROMUCOSAL

## 2023-02-11 MED ORDER — ORAL CARE MOUTH RINSE: 15.0000 mL | Freq: Once | OROMUCOSAL | Status: AC

## 2023-02-11 MED ORDER — LACTATED RINGERS IV SOLN: INTRAVENOUS | Status: DC

## 2023-02-11 MED ORDER — FAMOTIDINE 20 MG PO TABS
20.0000 mg | ORAL_TABLET | Freq: Once | ORAL | Status: AC
  Administered 2023-02-12: 20 mg via ORAL

## 2023-02-12 ENCOUNTER — Other Ambulatory Visit: Payer: Self-pay

## 2023-02-12 ENCOUNTER — Ambulatory Visit: Payer: Commercial Managed Care - PPO | Admitting: Urgent Care

## 2023-02-12 ENCOUNTER — Encounter: Payer: Self-pay | Admitting: Otolaryngology

## 2023-02-12 ENCOUNTER — Ambulatory Visit: Payer: Commercial Managed Care - PPO | Admitting: General Practice

## 2023-02-12 ENCOUNTER — Ambulatory Visit
Admission: RE | Admit: 2023-02-12 | Discharge: 2023-02-12 | Disposition: A | Discharge status: Home / Self Care | Payer: Commercial Managed Care - PPO | Attending: Otolaryngology | Admitting: Otolaryngology

## 2023-02-12 ENCOUNTER — Encounter
Admission: RE | Disposition: A | Discharge status: Home / Self Care | Payer: Self-pay | Source: Home / Self Care | Attending: Otolaryngology

## 2023-02-12 DIAGNOSIS — Z9889 Other specified postprocedural states: Secondary | ICD-10-CM

## 2023-02-12 DIAGNOSIS — D110 Benign neoplasm of parotid gland: Secondary | ICD-10-CM | POA: Insufficient documentation

## 2023-02-12 DIAGNOSIS — Z01812 Encounter for preprocedural laboratory examination: Secondary | ICD-10-CM

## 2023-02-12 HISTORY — PX: PAROTIDECTOMY: SHX2163

## 2023-02-12 LAB — POCT PREGNANCY, URINE: Preg Test, Ur: NEGATIVE

## 2023-02-12 SURGERY — EXCISION, PAROTID GLAND
Anesthesia: General | Laterality: Left

## 2023-02-12 MED ORDER — ONDANSETRON HCL 4 MG PO TABS
4.0000 mg | ORAL_TABLET | Freq: Three times a day (TID) | ORAL | 0 refills | Status: DC | PRN
  Filled 2023-02-12: qty 20, 7d supply, fill #0

## 2023-02-12 MED ORDER — ONDANSETRON HCL 4 MG/2ML IJ SOLN
INTRAMUSCULAR | Status: DC | PRN
Start: 2023-02-12 — End: 2023-02-12
  Administered 2023-02-12: 4 mg via INTRAVENOUS

## 2023-02-12 MED ORDER — FENTANYL CITRATE (PF) 100 MCG/2ML IJ SOLN
INTRAMUSCULAR | Status: AC
  Filled 2023-02-12: qty 2

## 2023-02-12 MED ORDER — PHENYLEPHRINE HCL (PRESSORS) 10 MG/ML IV SOLN
INTRAVENOUS | Status: DC | PRN
Start: 2023-02-12 — End: 2023-02-12
  Administered 2023-02-12: 100 ug via INTRAVENOUS

## 2023-02-12 MED ORDER — REMIFENTANIL HCL 1 MG IV SOLR
INTRAVENOUS | Status: AC
  Filled 2023-02-12: qty 1000

## 2023-02-12 MED ORDER — DEXAMETHASONE SODIUM PHOSPHATE 10 MG/ML IJ SOLN
INTRAMUSCULAR | Status: DC | PRN
  Administered 2023-02-12: 10 mg via INTRAVENOUS

## 2023-02-12 MED ORDER — ACETAMINOPHEN 10 MG/ML IV SOLN
INTRAVENOUS | Status: AC
  Filled 2023-02-12: qty 100

## 2023-02-12 MED ORDER — PROPOFOL 1000 MG/100ML IV EMUL
INTRAVENOUS | Status: AC
  Filled 2023-02-12: qty 100

## 2023-02-12 MED ORDER — FENTANYL CITRATE (PF) 100 MCG/2ML IJ SOLN
25.0000 ug | INTRAMUSCULAR | Status: DC | PRN
  Administered 2023-02-12 (×2): 25 ug via INTRAVENOUS

## 2023-02-12 MED ORDER — HEMOSTATIC AGENTS (NO CHARGE) OPTIME
TOPICAL | Status: DC | PRN
  Administered 2023-02-12: 1 via TOPICAL

## 2023-02-12 MED ORDER — PROPOFOL 10 MG/ML IV BOLUS
INTRAVENOUS | Status: AC
  Filled 2023-02-12: qty 20

## 2023-02-12 MED ORDER — LIDOCAINE HCL (CARDIAC) PF 100 MG/5ML IV SOSY
PREFILLED_SYRINGE | INTRAVENOUS | Status: DC | PRN
  Administered 2023-02-12: 100 mg via INTRAVENOUS

## 2023-02-12 MED ORDER — EPHEDRINE SULFATE-NACL 50-0.9 MG/10ML-% IV SOSY
PREFILLED_SYRINGE | INTRAVENOUS | Status: DC | PRN
Start: 2023-02-12 — End: 2023-02-12
  Administered 2023-02-12: 5 mg via INTRAVENOUS

## 2023-02-12 MED ORDER — ACETAMINOPHEN 10 MG/ML IV SOLN
INTRAVENOUS | Status: DC | PRN
  Administered 2023-02-12: 1000 mg via INTRAVENOUS

## 2023-02-12 MED ORDER — MIDAZOLAM HCL 2 MG/2ML IJ SOLN
INTRAMUSCULAR | Status: DC | PRN
  Administered 2023-02-12: 2 mg via INTRAVENOUS

## 2023-02-12 MED ORDER — CEPHALEXIN 500 MG PO CAPS
500.0000 mg | ORAL_CAPSULE | Freq: Three times a day (TID) | ORAL | 0 refills | Status: DC
  Filled 2023-02-12: qty 30, 10d supply, fill #0

## 2023-02-12 MED ORDER — DEXMEDETOMIDINE HCL IN NACL 200 MCG/50ML IV SOLN
INTRAVENOUS | Status: DC | PRN
Start: 2023-02-12 — End: 2023-02-12
  Administered 2023-02-12 (×3): 4 ug via INTRAVENOUS

## 2023-02-12 MED ORDER — 0.9 % SODIUM CHLORIDE (POUR BTL) OPTIME
TOPICAL | Status: DC | PRN
  Administered 2023-02-12: 500 mL

## 2023-02-12 MED ORDER — MIDAZOLAM HCL 2 MG/2ML IJ SOLN
INTRAMUSCULAR | Status: AC
  Filled 2023-02-12: qty 2

## 2023-02-12 MED ORDER — SUCCINYLCHOLINE CHLORIDE 200 MG/10ML IV SOSY
PREFILLED_SYRINGE | INTRAVENOUS | Status: DC | PRN
  Administered 2023-02-12: 100 mg via INTRAVENOUS

## 2023-02-12 MED ORDER — OXYCODONE HCL 5 MG PO TABS
ORAL_TABLET | ORAL | Status: AC
  Filled 2023-02-12: qty 1

## 2023-02-12 MED ORDER — LIDOCAINE-EPINEPHRINE 1 %-1:100000 IJ SOLN
INTRAMUSCULAR | Status: AC
  Filled 2023-02-12: qty 1

## 2023-02-12 MED ORDER — FENTANYL CITRATE (PF) 100 MCG/2ML IJ SOLN
INTRAMUSCULAR | Status: DC | PRN
  Administered 2023-02-12 (×3): 50 ug via INTRAVENOUS

## 2023-02-12 MED ORDER — PROPOFOL 10 MG/ML IV BOLUS
INTRAVENOUS | Status: DC | PRN
  Administered 2023-02-12: 125 ug/kg/min via INTRAVENOUS
  Administered 2023-02-12: 200 mg via INTRAVENOUS
  Administered 2023-02-12: 150 ug/kg/min via INTRAVENOUS

## 2023-02-12 MED ORDER — REMIFENTANIL HCL 1 MG IV SOLR
INTRAVENOUS | Status: DC | PRN
Start: 2023-02-12 — End: 2023-02-12
  Administered 2023-02-12: .1 ug/kg/min via INTRAVENOUS

## 2023-02-12 MED ORDER — BACITRACIN ZINC 500 UNIT/GM EX OINT
TOPICAL_OINTMENT | CUTANEOUS | Status: AC
  Filled 2023-02-12: qty 28.35

## 2023-02-12 MED ORDER — OXYCODONE HCL 5 MG/5ML PO SOLN: 5.0000 mg | Freq: Once | ORAL | Status: AC | PRN

## 2023-02-12 MED ORDER — OXYCODONE HCL 5 MG PO TABS
5.0000 mg | ORAL_TABLET | Freq: Once | ORAL | Status: AC | PRN
  Administered 2023-02-12: 5 mg via ORAL

## 2023-02-12 MED ORDER — LIDOCAINE-EPINEPHRINE 1 %-1:100000 IJ SOLN
INTRAMUSCULAR | Status: DC | PRN
  Administered 2023-02-12: 9 mL

## 2023-02-12 MED ORDER — FAMOTIDINE 20 MG PO TABS
ORAL_TABLET | ORAL | Status: AC
  Filled 2023-02-12: qty 1

## 2023-02-12 MED ORDER — OXYCODONE-ACETAMINOPHEN 5-325 MG PO TABS
1.0000 | ORAL_TABLET | Freq: Four times a day (QID) | ORAL | 0 refills | Status: AC | PRN
  Filled 2023-02-12: qty 40, 7d supply, fill #0

## 2023-02-12 MED ORDER — CHLORHEXIDINE GLUCONATE 0.12 % MT SOLN
OROMUCOSAL | Status: AC
  Filled 2023-02-12: qty 15

## 2023-02-12 SURGICAL SUPPLY — 48 items
ADH LQ OCL WTPRF AMP STRL LF (MISCELLANEOUS) ×1
ADH SKN CLS APL DERMABOND .7 (GAUZE/BANDAGES/DRESSINGS) ×1
ADHESIVE MASTISOL STRL (MISCELLANEOUS) ×1 IMPLANT
ATTRACTOMAT 16X20 MAGNETIC DRP (DRAPES) ×1 IMPLANT
BALL CTTN STRL (GAUZE/BANDAGES/DRESSINGS) ×1
BLADE SURG 15 STRL LF DISP TIS (BLADE) ×1 IMPLANT
BLADE SURG 15 STRL SS (BLADE) ×1
BULB RESERV EVAC DRAIN JP 100C (MISCELLANEOUS) IMPLANT
CORD BIP STRL DISP 12FT (MISCELLANEOUS) ×1 IMPLANT
COTTON BALL STERILE (GAUZE/BANDAGES/DRESSINGS) ×1
COTTON BALL STERILE 4 PK (GAUZE/BANDAGES/DRESSINGS) ×1 IMPLANT
DERMABOND ADVANCED .7 DNX12 (GAUZE/BANDAGES/DRESSINGS) ×1 IMPLANT
DRAIN JP 10F RND SILICONE (MISCELLANEOUS) IMPLANT
DRAPE SURG 17X11 SM STRL (DRAPES) ×1 IMPLANT
DRSG TEGADERM 2-3/8X2-3/4 SM (GAUZE/BANDAGES/DRESSINGS) ×3 IMPLANT
ELECT EMG 20MM DUAL (MISCELLANEOUS) ×2
ELECT REM PT RETURN 9FT ADLT (ELECTROSURGICAL) ×1
ELECTRODE EMG 20MM DUAL (MISCELLANEOUS) ×2 IMPLANT
ELECTRODE REM PT RTRN 9FT ADLT (ELECTROSURGICAL) ×1 IMPLANT
FORCEPS JEWEL BIP 4-3/4 STR (INSTRUMENTS) ×1 IMPLANT
GAUZE 4X4 16PLY ~~LOC~~+RFID DBL (SPONGE) ×2 IMPLANT
GLOVE BIO SURGEON STRL SZ7.5 (GLOVE) ×2 IMPLANT
GOWN STRL REUS W/ TWL LRG LVL3 (GOWN DISPOSABLE) ×3 IMPLANT
GOWN STRL REUS W/TWL LRG LVL3 (GOWN DISPOSABLE) ×3
HEMOSTAT SURGICEL 2X3 (HEMOSTASIS) IMPLANT
HOOK STAY BLUNT/RETRACTOR 5M (MISCELLANEOUS) IMPLANT
KIT TURNOVER KIT A (KITS) ×1 IMPLANT
LABEL OR SOLS (LABEL) ×1 IMPLANT
MANIFOLD NEPTUNE II (INSTRUMENTS) ×1 IMPLANT
MARKER SKIN DUAL TIP RULER LAB (MISCELLANEOUS) ×1 IMPLANT
NS IRRIG 500ML POUR BTL (IV SOLUTION) ×1 IMPLANT
PACK HEAD/NECK (MISCELLANEOUS) ×1 IMPLANT
PROBE NEUROSIGN BIPOL (MISCELLANEOUS) ×1 IMPLANT
PROBE NEUROSIGN BIPOLAR (MISCELLANEOUS) ×1
SHEARS HARMONIC 9CM CVD (BLADE) ×1 IMPLANT
SOL PREP PVP 2OZ (MISCELLANEOUS) ×1
SOLUTION PREP PVP 2OZ (MISCELLANEOUS) ×1 IMPLANT
SPONGE KITTNER 5P (MISCELLANEOUS) ×1 IMPLANT
STRIP CLOSURE SKIN 1/4X4 (GAUZE/BANDAGES/DRESSINGS) ×1 IMPLANT
SUT ETHILON 2 0 FS 18 (SUTURE) IMPLANT
SUT PROLENE 5 0 PS 3 (SUTURE) ×1 IMPLANT
SUT SILK 0 (SUTURE) ×1
SUT SILK 0 30XBRD TIE 6 (SUTURE) ×1 IMPLANT
SUT SILK 2 0 (SUTURE) ×1
SUT SILK 2-0 30XBRD TIE 12 (SUTURE) ×1 IMPLANT
SUT VIC AB 4-0 RB1 18 (SUTURE) ×1 IMPLANT
TRAP FLUID SMOKE EVACUATOR (MISCELLANEOUS) ×1 IMPLANT
WATER STERILE IRR 500ML POUR (IV SOLUTION) ×1 IMPLANT

## 2023-02-12 NOTE — Op Note (Signed)
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  Facial nerve dissected and preserved and stimulated with all branches at end of procedure.  Description of Procedure:  After the patient was identified in holding and the history and physical and consent was reviewed and updated.  The patient was marked in an upright position on the left side along a natural occuring skin crease.  The patient was next taken to the operating room and placed in a supine position.  General endotracheal anesthesia was induced.  The patient's anterior facial and neck crease was neck injected with 9cc's of 1% lidocaine with 1:100,000 Epinephrine.  Facial nerve monitoring electrodes were placed on the orbicularis oris and oculi muscles.  The patient was next prepped and draped in a sterile normal fashion.   At this time, a 15 blade scalpel was used to make a skin incision along a previously marked preauricular and neck crease.  Dissection was carefully performed through the subcutaneous tissues with combination of Bovie electrocautery and blunt dissection.  The SMAS was encountered and an anterior flap elevated superior and anterior and inferior the parotid mass.  The mass was approximately 2cm in size and firm and in the tail of the parotid.  Stays were used to retract the patient's flap and ear lobe for visualization.  The parotid was separated from the cartilagenous and then bone external auditory canal.  The SCM was identified inferiorly and the posterior and inferior aspect of the parotid was divided from  the muscles.  The greater auricular nerve was divided with Harmonic scalpel.  The mass extended from the parotid gland to the [facility] and was meticulously dissected from the SCM to avoid disruption of the capsule.   The mastoid suture line was identified and careful dissection inferiorly was made and deep and the main branch of the facial nerve identified.  This stimulated robustly of all branches.  Careful dissection was continued until the [facility] was encountered.  This demonstrated the superior and inferior branches with connecting buccal branch between the two.  The superior branch was noted to be far away from the tumor so was not dissected.  Care was taken to avoid injury to the nerve as the parotid and tumor were dissected.  Inferiorly the inferior branch of the nerve was traced and dissected passed anterior and superior to the tumor.  The inferior branches were dissected inferiorly and the remaining inferior attachments of the parotid to the tumor and surrounding parotid were removed.  The remaining attachments to the mass were separated with harmonic scalpel and the mass was sent for permanent pathological evaluation.   The wound was copiously irrigated with sterile saline.  Meticulous hemostasis with bipolar was obtained.  Visualization of an intact left facial nerve was made.  Stimulation of the main branch moved all muscles as well as stimulation of the inferior branch moved the inferior facial muscles.  Hemostasis was continued.  A 10 french drain was placed in the wound after surgicel was place  The SMAS was closed with 4.0 vicryl in an  interrupted fashion.  The subcuanteous tissues were closed with interrupted 4.0 vicryl.  The skin was closed with running Prolene suture and topped with bacitracin ointment.   At this time the patient was extubated and taken to PACU in good condition.   Plan:  Follow pathology.  Limit activity for 2 weeks.  Follow up next week for post-operative evaluation and suture  removal.  Follow drain.   Kayla Parrish  02/12/2023 9:21 AM

## 2023-02-12 NOTE — H&P (Signed)
..  History and Physical paper copy reviewed and updated date of procedure and will be scanned into system.  Patient seen and examined.

## 2023-02-12 NOTE — Anesthesia Procedure Notes (Signed)
Procedure Name: Intubation Date/Time: 02/12/2023 7:47 AM  Performed by: Maryla Morrow., CRNAPre-anesthesia Checklist: Patient identified, Patient being monitored, Timeout performed, Emergency Drugs available and Suction available Patient Re-evaluated:Patient Re-evaluated prior to induction Oxygen Delivery Method: Circle system utilized Preoxygenation: Pre-oxygenation with 100% oxygen Induction Type: IV induction Ventilation: Mask ventilation without difficulty Laryngoscope Size: 3 and McGraph Grade View: Grade I Tube type: Oral Tube size: 7.0 mm Number of attempts: 1 Airway Equipment and Method: Stylet Placement Confirmation: ETT inserted through vocal cords under direct vision, positive ETCO2 and breath sounds checked- equal and bilateral Secured at: 21 cm Tube secured with: Tape Dental Injury: Teeth and Oropharynx as per pre-operative assessment

## 2023-02-12 NOTE — Anesthesia Preprocedure Evaluation (Signed)
Anesthesia Evaluation  Patient identified by MRN, date of birth, ID band Patient awake    Reviewed: Allergy & Precautions, NPO status , Patient's Chart, lab work & pertinent test results  Airway Mallampati: III  TM Distance: >3 FB Neck ROM: full    Dental  (+) Chipped, Dental Advidsory Given   Pulmonary neg pulmonary ROS, neg shortness of breath   Pulmonary exam normal        Cardiovascular negative cardio ROS Normal cardiovascular exam     Neuro/Psych negative neurological ROS  negative psych ROS   GI/Hepatic negative GI ROS, Neg liver ROS,,,  Endo/Other  negative endocrine ROS    Renal/GU      Musculoskeletal   Abdominal   Peds  Hematology negative hematology ROS (+)   Anesthesia Other Findings Past Medical History: No date: Acute gallstone pancreatitis 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 01/16/2015: Cholelithiases No date: Family history of adverse reaction to anesthesia     Comment:  mother and grandmother has post op N/V No date: Lymphadenopathy 01/16/2015: Pancreatitis, acute  Past Surgical History: 01/19/2015: CHOLECYSTECTOMY; N/A     Comment:  Procedure: LAPAROSCOPIC CHOLECYSTECTOMY WITH               INTRAOPERATIVE CHOLANGIOGRAM;  Surgeon: Ida Rogue, MD;  Location: [facility] ORS;  Service: General;                Laterality: N/A; 12/28/2017: TUBAL LIGATION; Bilateral     Comment:  Procedure: POST PARTUM TUBAL LIGATION;  Surgeon:               Conard Novak, MD;  Location: [facility] ORS;  Service:               Gynecology;  Laterality: Bilateral;  BMI    Body Mass Index: 33.01 kg/m      Reproductive/Obstetrics negative OB ROS                             Anesthesia Physical Anesthesia Plan  ASA: 2  Anesthesia Plan: General ETT and General   Post-op Pain Management:    Induction: Intravenous  PONV Risk Score and Plan: 3 and Ondansetron,  Dexamethasone and Midazolam  Airway Management Planned: Oral ETT  Additional Equipment:   Intra-op Plan:   Post-operative Plan: Extubation in OR  Informed Consent: I have reviewed the patients History and Physical, chart, labs and discussed the procedure including the risks, benefits and alternatives for the proposed anesthesia with the patient or authorized representative who has indicated his/her understanding and acceptance.     Dental Advisory Given  Plan Discussed with: Anesthesiologist, CRNA and Surgeon  Anesthesia Plan Comments: (Patient consented for risks of anesthesia including but not limited to:  - adverse reactions to medications - damage to eyes, teeth, lips or other oral mucosa - nerve damage due to positioning  - sore throat or hoarseness - Damage to heart, brain, nerves, lungs, other parts of body or loss of life  Patient voiced understanding.)       Anesthesia Quick Evaluation

## 2023-02-12 NOTE — Transfer of Care (Signed)
Immediate Anesthesia Transfer of Care Note  Patient: Kayla Parrish  Procedure(s) Performed: PAROTIDECTOMY (Left)  Patient Location: PACU  Anesthesia Type:General  Level of Consciousness: awake and alert   Airway & Oxygen Therapy: Patient Spontanous Breathing and Patient connected to face mask oxygen  Post-op Assessment: Report given to RN and Post -op Vital signs reviewed and stable  Post vital signs: stable  Last Vitals:  Vitals Value Taken Time  BP 108/66 02/12/23 0932  Temp 36.2 C 02/12/23 0930  Pulse 78 02/12/23 0934  Resp 18 02/12/23 0934  SpO2 91 % 02/12/23 0934  Vitals shown include unfiled device data.  Last Pain:  Vitals:   02/12/23 0716  TempSrc: Temporal  PainSc:          Complications: No notable events documented.

## 2023-02-12 NOTE — Discharge Instructions (Signed)
AMBULATORY SURGERY  DISCHARGE INSTRUCTIONS   The drugs that you were given will stay in your system until tomorrow so for the next 24 hours you should not:  Drive an automobile Make any legal decisions Drink any alcoholic beverage   You may resume regular meals tomorrow.  Today it is better to start with liquids and gradually work up to solid foods.  You may eat anything you prefer, but it is better to start with liquids, then soup and crackers, and gradually work up to solid foods.   Please notify your doctor immediately if you have any unusual bleeding, trouble breathing, redness and pain at the surgery site, drainage, fever, or pain not relieved by medication.    Additional Instructions:

## 2023-02-13 NOTE — Anesthesia Postprocedure Evaluation (Signed)
Anesthesia Post Note  Patient: Kayla Parrish  Procedure(s) Performed: PAROTIDECTOMY (Left)  Patient location during evaluation: PACU Anesthesia Type: General Level of consciousness: awake and alert Pain management: pain level controlled Vital Signs Assessment: post-procedure vital signs reviewed and stable Respiratory status: spontaneous breathing, nonlabored ventilation, respiratory function stable and patient connected to nasal cannula oxygen Cardiovascular status: blood pressure returned to baseline and stable Postop Assessment: no apparent nausea or vomiting Anesthetic complications: no   There were no known notable events for this encounter.   Last Vitals:  Vitals:   02/12/23 1015 02/12/23 1020  BP: 125/85 124/82  Pulse: 68   Resp: 19 18  Temp:  36.7 C  SpO2: 98% 98%    Last Pain:  Vitals:   02/12/23 1020  TempSrc: Temporal  PainSc: 2                  Stephanie Coup

## 2023-02-14 LAB — SURGICAL PATHOLOGY

## 2023-02-18 ENCOUNTER — Other Ambulatory Visit: Payer: Self-pay | Admitting: Nurse Practitioner

## 2023-02-18 ENCOUNTER — Other Ambulatory Visit: Payer: Self-pay

## 2023-02-18 DIAGNOSIS — E66812 Obesity, class 2: Secondary | ICD-10-CM

## 2023-02-18 MED ORDER — PHENTERMINE-TOPIRAMATE ER 7.5-46 MG PO CP24
1.0000 | ORAL_CAPSULE | Freq: Every day | ORAL | 0 refills | Status: DC
  Filled 2023-02-18: qty 30, 30d supply, fill #0

## 2023-02-19 ENCOUNTER — Other Ambulatory Visit: Payer: Self-pay

## 2023-03-24 ENCOUNTER — Encounter: Payer: Self-pay | Admitting: Nurse Practitioner

## 2023-03-25 ENCOUNTER — Other Ambulatory Visit: Payer: Self-pay

## 2023-03-25 ENCOUNTER — Other Ambulatory Visit: Payer: Self-pay | Admitting: Nurse Practitioner

## 2023-03-25 DIAGNOSIS — E6609 Other obesity due to excess calories: Secondary | ICD-10-CM

## 2023-03-25 MED ORDER — QSYMIA 11.25-69 MG PO CP24
1.0000 | ORAL_CAPSULE | Freq: Every day | ORAL | 0 refills | Status: DC
Start: 2023-03-25 — End: 2023-04-15
  Filled 2023-03-25 – 2023-04-15 (×4): qty 30, 30d supply, fill #0

## 2023-04-02 ENCOUNTER — Encounter: Payer: Self-pay | Admitting: Nurse Practitioner

## 2023-04-05 ENCOUNTER — Other Ambulatory Visit: Payer: Self-pay

## 2023-04-10 ENCOUNTER — Other Ambulatory Visit: Payer: Self-pay

## 2023-04-15 ENCOUNTER — Ambulatory Visit (INDEPENDENT_AMBULATORY_CARE_PROVIDER_SITE_OTHER): Payer: Commercial Managed Care - PPO | Admitting: Nurse Practitioner

## 2023-04-15 ENCOUNTER — Other Ambulatory Visit: Payer: Self-pay

## 2023-04-15 VITALS — BP 120/82 | HR 92 | Temp 98.1°F | Resp 16 | Ht 60.0 in | Wt 168.2 lb

## 2023-04-15 DIAGNOSIS — E66812 Obesity, class 2: Secondary | ICD-10-CM

## 2023-04-15 DIAGNOSIS — Z6836 Body mass index (BMI) 36.0-36.9, adult: Secondary | ICD-10-CM | POA: Diagnosis not present

## 2023-04-15 DIAGNOSIS — E6609 Other obesity due to excess calories: Secondary | ICD-10-CM | POA: Diagnosis not present

## 2023-04-15 MED ORDER — TOPIRAMATE 50 MG PO TABS
50.0000 mg | ORAL_TABLET | Freq: Every day | ORAL | 0 refills | Status: DC
Start: 2023-04-15 — End: 2023-07-16
  Filled 2023-04-15: qty 90, 90d supply, fill #0

## 2023-04-15 MED ORDER — PHENTERMINE HCL 15 MG PO CAPS
15.0000 mg | ORAL_CAPSULE | ORAL | 0 refills | Status: DC
Start: 2023-04-15 — End: 2023-07-16
  Filled 2023-04-15: qty 90, 90d supply, fill #0

## 2023-04-15 NOTE — Progress Notes (Addendum)
BP 120/82   Pulse 92   Temp 98.1 F (36.7 C) (Oral)   Resp 16   Ht 5' (1.524 m)   Wt 168 lb 3.2 oz (76.3 kg)   SpO2 98%   BMI 32.85 kg/m    Subjective:    Patient ID: Kayla Parrish, female    DOB: Feb 02, 1991, 32 y.o.   MRN: [id number]  HPI: Kayla Parrish is a 32 y.o. female  Chief Complaint  Patient presents with   Medical Management of Chronic Issues    3 month follow up   Discussed the use of AI scribe software for clinical note transcription with the patient, who gave verbal consent to proceed.  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 She reports a weight loss of 5 pounds since her last visit. She has been on Qsymia for weight loss, but has been experiencing issues with insurance authorization for the medication. Despite the interruption in medication, she has not noticed a significant increase in appetite or weight gain. She reports that the medication was well-tolerated and she noticed a decrease in portion sizes when eating.  The patient also discusses her recent surgery, noting that the healing process has been slower than expected due to her skin's tendency to scar and heal slowly. She completed a course of antibiotics and reports no signs of infection. She is being followed by another doctor for a thyroid nodule detected on a CT scan, with a repeat ultrasound scheduled for July.  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   Lymphadenopathy: last visit noted lymphadenopathy on the left side of her neck.  Ordered an ultrasound, which showed: Patient's palpable area of concern involving the left side of the face/neck correlates with an enlarged lymph node either within or immediately adjacent to the left parotid gland - if not previously performed, ENT consultation is advised.While potentially reactive in  etiology, given reported persistence of this nodule for the past year, further evaluation with contrast-enhanced neck CT could be performed as indicated. Ct scan was ordered and showed : 1. 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 Tissue sampling is recommended. 2. Possible 1.5 cm hypoenhancing lesion in the right thyroid lobe. If this has not previously been evaluated, a non-emergent ultrasound of the thyroid is recommended. (Reference: J Am Coll Radiol. 2015 Feb;12(2): 143-50) 3. Prominent upper cervical lymph nodes, favored to be reactive. No abnormal density lymph nodes.  Referral had been placed to ent.  Patient saw Dr. Monia Pouch.  Who did a biopsy and has now scheduled patient for parotidectomy on 02/12/2023.  Relevant past medical, surgical, family and social history reviewed and updated as indicated. Interim medical history since our last visit reviewed. Allergies and medications reviewed and updated.  Review of Systems  Constitutional: Negative for fever or weight change.  Respiratory: Negative for cough and shortness of breath.   Cardiovascular: Negative for chest pain or palpitations.  Gastrointestinal: Negative for abdominal pain, no bowel changes.  Musculoskeletal: Negative for gait problem or joint swelling.  Skin: Negative for rash.  Neurological: Negative for dizziness or headache.  No other specific complaints in a complete review of systems (except as listed in HPI  above).      Objective:    BP 120/82   Pulse 92   Temp 98.1 F (36.7 C) (Oral)   Resp 16   Ht 5' (1.524 m)   Wt 168 lb 3.2 oz (76.3 kg)   SpO2 98%   BMI 32.85 kg/m   Wt Readings from Last 3 Encounters:  04/15/23 168 lb 3.2 oz (76.3 kg)  02/12/23 169 lb (76.7 kg)  02/06/23 172 lb (78 kg)    Physical Exam  Constitutional: Patient appears well-developed and  well-nourished. Obese  No distress.  HEENT: head atraumatic, normocephalic, pupils equal and reactive to light, ears TMs clear, neck supple, throat within normal limits.  Cardiovascular: Normal rate, regular rhythm and normal heart sounds.  No murmur heard. No BLE edema. Pulmonary/Chest: Effort normal and breath sounds normal. No respiratory distress. Abdominal: Soft.  There is no tenderness. Psychiatric: Patient has a normal mood and affect. behavior is normal. Judgment and thought content normal.  Results for orders placed or performed during the hospital encounter of 02/12/23  Pregnancy, urine POC  Result Value Ref Range   Preg Test, Ur NEGATIVE NEGATIVE  Surgical pathology  Result Value Ref Range   SURGICAL PATHOLOGY      SURGICAL PATHOLOGY [facility] [address] [place], [place] [address] Telephone [phone] or [phone] Fax [phone]  REPORT OF SURGICAL PATHOLOGY   Accession #: [id number] Patient Name: Kayla Parrish, Kayla Parrish Visit # : [id number]  MRN: [id number] Physician: Bud Face DOB/Age [birth year] (Age: 83) Gender: F Collected Date: 02/12/2023 Received Date: 02/12/2023  FINAL DIAGNOSIS       1. 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      - PRIOR FNA/BIOPSY SITE CHANGE.      - UNREMARKABLE LYMPH NODE.      - NEGATIVE FOR ATYPIA AND MALIGNANCY.       DATE SIGNED OUT: 02/14/2023 ELECTRONIC SIGNATURE : Oneita Kras Md, Delice Bison , Pathologist, Electronic Signature  MICROSCOPIC DESCRIPTION  CASE COMMENTS STAINS USED IN DIAGNOSIS: H&E H&E H&E H&E    CLINICAL HISTORY  SPECIMEN(S) OBTAINED 1. Parotid gland, Left  SPECIMEN COMMENTS: 1. Mass SPECIMEN CLINICAL INFORMATION: 1. Benign neoplasm par otid gland    Gross Description 1. Received in formalin labeled with the patient's name and "Left parotid mass" is a 5.5 g, 3.3 x 1.8 x 1.6 cm red, rubbery, ragged piece of tissue  with yellow-tan, lobular cut surfaces grossly consistent with glandular parenchyma. Sectioning reveals a 1.8 x 1.5 x 1.3 cm red, friable lesion that grossly abuts the outer surface/resection margin (inked black).      Block Summary      1A-C: entire lesion      1D: uninvolved parenchyma    (LEF, 02/12/2023)        Report signed out from the following location(s) [facility] [address], [place] [address] CLIA #: [id number]  [facility] [address] [place], [place] [address] CLIA #: [id number]       Assessment & Plan:   Problem List Items Addressed This Visit       Other   Class 2 obesity due to excess calories without serious comorbidity with body mass index (BMI) of 36.0 to 36.9 in adult - Primary   Relevant Medications   phentermine 15 MG capsule   topiramate (TOPAMAX) 50 MG tablet     Assessment and Plan  Postoperative Care Recent surgery with good recovery. Noted slow healing of surgical scar, but no signs of infection. -Continue current wound care regimen.  Thyroid Nodule Identified on CT scan, planned follow-up with ultrasound in July. -Continue monitoring as planned.  Weight Management Patient has been on Qsymia with noted appetite suppression and weight loss. However, there have been issues with insurance coverage and obtaining the medication. -Discontinue Qsymia due to insurance issues. -Start Phentermine 15mg  and Topamax 50mg  daily as an alternative regimen.  General Health Maintenance -Continue current medications and lifestyle modifications. -Report any issues with new medication regimen. -Follow-up as needed.         Follow up plan: Return in about 3 months (around 07/16/2023) for follow up.

## 2023-05-31 ENCOUNTER — Encounter: Payer: Self-pay | Admitting: Nurse Practitioner

## 2023-06-04 ENCOUNTER — Other Ambulatory Visit: Payer: Self-pay | Admitting: Nurse Practitioner

## 2023-06-04 DIAGNOSIS — Z111 Encounter for screening for respiratory tuberculosis: Secondary | ICD-10-CM

## 2023-06-05 DIAGNOSIS — Z111 Encounter for screening for respiratory tuberculosis: Secondary | ICD-10-CM | POA: Diagnosis not present

## 2023-06-09 LAB — QUANTIFERON-TB GOLD PLUS
Mitogen-NIL: >10 [IU]/mL
NIL: 0.04 [IU]/mL
QuantiFERON-TB Gold Plus: NEGATIVE
TB1-NIL: 0 [IU]/mL
TB2-NIL: 0 [IU]/mL

## 2023-06-10 ENCOUNTER — Encounter: Payer: Self-pay | Admitting: Nurse Practitioner

## 2023-07-15 NOTE — Progress Notes (Unsigned)
   There were no vitals taken for this visit.   Subjective:    Patient ID: Kayla Parrish, female    DOB: [birth year], 33 y.o.   MRN: [id number]  HPI: Kayla Parrish is a 33 y.o. female  No chief complaint on file.   Discussed the use of AI scribe software for clinical note transcription with the patient, who gave verbal consent to proceed.  History of Present Illness           04/15/2023    9:06 AM 01/11/2023    9:43 AM 12/31/2022   11:44 AM  Depression screen PHQ 2/9  Decreased Interest 0 0 0  Down, Depressed, Hopeless 0 0 0  PHQ - 2 Score 0 0 0    Relevant past medical, surgical, family and social history reviewed and updated as indicated. Interim medical history since our last visit reviewed. Allergies and medications reviewed and updated.  Review of Systems  Per HPI unless specifically indicated above     Objective:    There were no vitals taken for this visit.  {Vitals History (Optional):23777} Wt Readings from Last 3 Encounters:  04/15/23 168 lb 3.2 oz (76.3 kg)  02/12/23 169 lb (76.7 kg)  02/06/23 172 lb (78 kg)    Physical Exam  Results for orders placed or performed in visit on 06/04/23  QuantiFERON-TB Gold Plus   Collection Time: 06/05/23  8:16 AM  Result Value Ref Range   QuantiFERON-TB Gold Plus NEGATIVE NEGATIVE   NIL 0.04 IU/mL   Mitogen-NIL >10.00 IU/mL   TB1-NIL 0.00 IU/mL   TB2-NIL 0.00 IU/mL   {Labs (Optional):23779}    Assessment & Plan:   Problem List Items Addressed This Visit   None    Assessment and Plan             Follow up plan: No follow-ups on file.

## 2023-07-16 ENCOUNTER — Other Ambulatory Visit: Payer: Self-pay

## 2023-07-16 ENCOUNTER — Ambulatory Visit: Payer: Commercial Managed Care - PPO | Admitting: Nurse Practitioner

## 2023-07-16 ENCOUNTER — Encounter: Payer: Self-pay | Admitting: Nurse Practitioner

## 2023-07-16 VITALS — BP 118/72 | HR 98 | Temp 98.0°F | Resp 16 | Ht 60.0 in | Wt 163.2 lb

## 2023-07-16 DIAGNOSIS — Z6831 Body mass index (BMI) 31.0-31.9, adult: Secondary | ICD-10-CM

## 2023-07-16 DIAGNOSIS — E6609 Other obesity due to excess calories: Secondary | ICD-10-CM | POA: Diagnosis not present

## 2023-07-16 DIAGNOSIS — Z79899 Other long term (current) drug therapy: Secondary | ICD-10-CM | POA: Diagnosis not present

## 2023-07-16 DIAGNOSIS — E66811 Obesity, class 1: Secondary | ICD-10-CM

## 2023-07-16 MED ORDER — PHENTERMINE HCL 15 MG PO CAPS
15.0000 mg | ORAL_CAPSULE | ORAL | 1 refills | Status: DC
  Filled 2023-07-16: qty 90, 90d supply, fill #0
  Filled 2023-11-06: qty 90, 90d supply, fill #1

## 2023-07-16 MED ORDER — TOPIRAMATE 50 MG PO TABS
50.0000 mg | ORAL_TABLET | Freq: Every day | ORAL | 1 refills | Status: DC
Start: 2023-07-16 — End: 2024-01-13
  Filled 2023-07-16: qty 90, 90d supply, fill #0
  Filled 2023-11-06: qty 90, 90d supply, fill #1

## 2023-07-17 DIAGNOSIS — Z01419 Encounter for gynecological examination (general) (routine) without abnormal findings: Secondary | ICD-10-CM | POA: Diagnosis not present

## 2023-11-06 ENCOUNTER — Other Ambulatory Visit: Payer: Self-pay

## 2024-01-13 ENCOUNTER — Other Ambulatory Visit: Payer: Self-pay

## 2024-01-13 ENCOUNTER — Encounter: Payer: Self-pay | Admitting: Nurse Practitioner

## 2024-01-13 ENCOUNTER — Ambulatory Visit: Payer: Commercial Managed Care - PPO | Admitting: Nurse Practitioner

## 2024-01-13 VITALS — BP 126/82 | HR 94 | Resp 16 | Ht 60.0 in | Wt 168.1 lb

## 2024-01-13 DIAGNOSIS — Z6836 Body mass index (BMI) 36.0-36.9, adult: Secondary | ICD-10-CM | POA: Diagnosis not present

## 2024-01-13 DIAGNOSIS — E66812 Obesity, class 2: Secondary | ICD-10-CM | POA: Diagnosis not present

## 2024-01-13 DIAGNOSIS — L700 Acne vulgaris: Secondary | ICD-10-CM

## 2024-01-13 DIAGNOSIS — E6609 Other obesity due to excess calories: Secondary | ICD-10-CM

## 2024-01-13 DIAGNOSIS — Z131 Encounter for screening for diabetes mellitus: Secondary | ICD-10-CM | POA: Diagnosis not present

## 2024-01-13 DIAGNOSIS — Z13220 Encounter for screening for lipoid disorders: Secondary | ICD-10-CM | POA: Diagnosis not present

## 2024-01-13 DIAGNOSIS — Z130 Encounter for screening for diseases of the blood and blood-forming organs and certain disorders involving the immune mechanism: Secondary | ICD-10-CM

## 2024-01-13 MED ORDER — PHENTERMINE HCL 37.5 MG PO TABS
37.5000 mg | ORAL_TABLET | Freq: Every day | ORAL | 1 refills | Status: AC
  Filled 2024-01-13: qty 90, 90d supply, fill #0
  Filled 2024-06-18: qty 90, 90d supply, fill #1

## 2024-01-13 MED ORDER — TRETINOIN 0.05 % EX CREA
TOPICAL_CREAM | Freq: Every day | CUTANEOUS | 3 refills | Status: AC
  Filled 2024-01-13: qty 45, 30d supply, fill #0
  Filled 2024-05-01: qty 45, 30d supply, fill #1

## 2024-01-13 MED ORDER — TOPIRAMATE 50 MG PO TABS
50.0000 mg | ORAL_TABLET | Freq: Every day | ORAL | 1 refills | Status: AC
  Filled 2024-01-13 – 2024-01-17 (×2): qty 90, 90d supply, fill #0
  Filled 2024-05-01: qty 90, 90d supply, fill #1

## 2024-01-13 NOTE — Progress Notes (Signed)
 BP 126/82   Pulse 94   Resp 16   Ht 5' (1.524 m)   Wt 168 lb 1.6 oz (76.2 kg)   LMP 01/12/2024 (Exact Date)   SpO2 100%   Breastfeeding No   BMI 32.83 kg/m    Subjective:    Patient ID: Benton LOISE Irving, female    DOB: [birth year], 33 y.o.   MRN: [id number]  HPI: FRIMY UFFELMAN is a 33 y.o. female  Chief Complaint  Patient presents with   Medical Management of Chronic Issues    Discussed the use of AI scribe software for clinical note transcription with the patient, who gave verbal consent to proceed.  History of Present Illness KATHELEEN STELLA is a 33 year old female who presents for a routine follow-up for weight management.  Weight gain and appetite - Weight increased from 163 pounds to 168 pounds since last appointment - Current BMI is 31.87 - Appetite not significantly changed despite medication - No significant side effects such as heart palpitations on higher dose of phentermine  - Encourage continuation of lifestyle modifications, including dietary management and regular exercise. -continue to increase physical activity, getting at least 150 min of physical activity a week.  Work on including [employer], broadcasting/film/video 2 days a week.  - continue eating at a calorie deficit 1600-1700 cal a day, eating a well balanced diet with whole foods, avoiding processed foods.   Patient is motivated to continue working on lifestyle modification.    Pharmacologic weight management - Currently taking phentermine  15 mg, reduced from 37.5 mg due to insurance issues with Qsymia  - Perceives reduced efficacy of current phentermine  dose, feeling 'immune' to its effects - Tolerated higher dose of phentermine  without significant side effects  Facial acne - Significant worsening of facial acne, described as 'tremendously' worse than before - Acne possibly cystic in nature - Acne primarily affects the face - No improvement with twice daily face washing - Suspects hormonal changes as a  contributing factor after discontinuing birth control following many years of use    Waist Measurement : 37 inches  Body mass index is 32.83 kg/m.  Filed Weights   01/13/24 0819  Weight: 168 lb 1.6 oz (76.2 kg)        01/13/2024    8:20 AM 04/15/2023    9:06 AM 01/11/2023    9:43 AM  Depression screen PHQ 2/9  Decreased Interest 0 0 0  Down, Depressed, Hopeless 0 0 0  PHQ - 2 Score 0 0 0    Relevant past medical, surgical, family and social history reviewed and updated as indicated. Interim medical history since our last visit reviewed. Allergies and medications reviewed and updated.  Review of Systems  Constitutional: Negative for fever or weight change.  Respiratory: Negative for cough and shortness of breath.   Cardiovascular: Negative for chest pain or palpitations.  Gastrointestinal: Negative for abdominal pain, no bowel changes.  Musculoskeletal: Negative for gait problem or joint swelling.  Skin: Negative for rash.  Neurological: Negative for dizziness or headache.  No other specific complaints in a complete review of systems (except as listed in HPI above).      Objective:     BP 126/82   Pulse 94   Resp 16   Ht 5' (1.524 m)   Wt 168 lb 1.6 oz (76.2 kg)   LMP 01/12/2024 (Exact Date)   SpO2 100%   Breastfeeding No   BMI 32.83 kg/m    Wt Readings from Last  3 Encounters:  01/13/24 168 lb 1.6 oz (76.2 kg)  07/16/23 163 lb 3.2 oz (74 kg)  04/15/23 168 lb 3.2 oz (76.3 kg)    Physical Exam Physical Exam MEASUREMENTS: Weight- 168. GENERAL: Alert, cooperative, well developed, no acute distress. HEENT: Normocephalic, normal oropharynx, moist mucous membranes. CHEST: Clear to auscultation bilaterally, no wheezes, rhonchi, or crackles. CARDIOVASCULAR: Normal heart rate and rhythm, S1 and S2 normal without murmurs. ABDOMEN: Soft, non-tender, non-distended, without organomegaly, normal bowel sounds. EXTREMITIES: No cyanosis or edema. NEUROLOGICAL: Cranial  nerves grossly intact, moves all extremities without gross motor or sensory deficit.   Results for orders placed or performed in visit on 06/04/23  QuantiFERON-TB Gold Plus   Collection Time: 06/05/23  8:16 AM  Result Value Ref Range   QuantiFERON-TB Gold Plus NEGATIVE NEGATIVE   NIL 0.04 IU/mL   Mitogen-NIL >10.00 IU/mL   TB1-NIL 0.00 IU/mL   TB2-NIL 0.00 IU/mL          Assessment & Plan:   Problem List Items Addressed This Visit       Other   Class 2 obesity due to excess calories without serious comorbidity with body mass index (BMI) of 36.0 to 36.9 in adult - Primary   Relevant Medications   phentermine  (ADIPEX-P ) 37.5 MG tablet   topiramate  (TOPAMAX ) 50 MG tablet   Other Relevant Orders   TSH   Other Visit Diagnoses       Screening for deficiency anemia       Relevant Orders   CBC with Differential/Platelet     Screening for cholesterol level       Relevant Orders   Lipid panel     Screening for diabetes mellitus       Relevant Orders   Comprehensive metabolic panel with GFR   Hemoglobin A1c     Acne vulgaris       Relevant Medications   tretinoin  (RETIN-A ) 0.05 % cream   Other Relevant Orders   Ambulatory referral to Dermatology        Assessment and Plan Assessment & Plan Obesity Obesity with a BMI of 31.87. Weight increased from 163 pounds to 168 pounds. Previous treatment with phentermine  15 mg was ineffective due to possible tolerance. Qsymia  was not continued due to insurance issues. She tolerated the higher dose of phentermine  previously without adverse effects such as heart palpitations. - Increase phentermine  dose to 30 mg by taking two 15 mg tablets. - Order lab work.  Acne Facial acne, possibly cystic, with significant breakout. Likely related to hormonal changes as she is in her mid-thirties and has been off birth control for some time. Dermatology referral discussed but noted to have a long wait time. - Prescribe tretinoin  cream, advise to  apply three times a week initially to assess tolerance. - Discuss potential dryness as a side effect of tretinoin . - Consider switching to clindamycin if tretinoin  is not tolerated. - Refer to dermatology, acknowledging long wait time.        Follow up plan: Return in about 6 months (around 07/15/2024) for follow up.

## 2024-01-14 ENCOUNTER — Ambulatory Visit: Payer: Self-pay | Admitting: Nurse Practitioner

## 2024-01-14 LAB — LIPID PANEL
Cholesterol: 141 mg/dL (ref <200)
HDL: 39 mg/dL — ABNORMAL LOW (ref >=50)
LDL Cholesterol (Calc): 81 mg/dL
Non-HDL Cholesterol (Calc): 102 mg/dL (ref <130)
Total CHOL/HDL Ratio: 3.6 (calc) (ref <5.0)
Triglycerides: 114 mg/dL (ref <150)

## 2024-01-14 LAB — COMPREHENSIVE METABOLIC PANEL WITH GFR
AG Ratio: 1.3 (calc) (ref 1.0–2.5)
ALT: 12 U/L (ref 6–29)
AST: 14 U/L (ref 10–30)
Albumin: 4.1 g/dL (ref 3.6–5.1)
Alkaline phosphatase (APISO): 61 U/L (ref 31–125)
BUN: 9 mg/dL (ref 7–25)
CO2: 24 mmol/L (ref 20–32)
Calcium: 8.8 mg/dL (ref 8.6–10.2)
Chloride: 108 mmol/L (ref 98–110)
Creat: 0.68 mg/dL (ref 0.50–0.97)
Globulin: 3.2 g/dL (ref 1.9–3.7)
Glucose, Bld: 61 mg/dL — ABNORMAL LOW (ref 65–99)
Potassium: 3.8 mmol/L (ref 3.5–5.3)
Sodium: 138 mmol/L (ref 135–146)
Total Bilirubin: 0.5 mg/dL (ref 0.2–1.2)
Total Protein: 7.3 g/dL (ref 6.1–8.1)
eGFR: 116 mL/min/1.73m2 (ref >=60)

## 2024-01-14 LAB — CBC WITH DIFFERENTIAL/PLATELET
Absolute Lymphocytes: 2431 {cells}/uL (ref 850–3900)
Absolute Monocytes: 624 {cells}/uL (ref 200–950)
Basophils Absolute: 72 {cells}/uL (ref 0–200)
Basophils Relative: 1.1 %
Eosinophils Absolute: 169 {cells}/uL (ref 15–500)
Eosinophils Relative: 2.6 %
HCT: 37.8 % (ref 35.0–45.0)
Hemoglobin: 12.3 g/dL (ref 11.7–15.5)
MCH: 28.5 pg (ref 27.0–33.0)
MCHC: 32.5 g/dL (ref 32.0–36.0)
MCV: 87.5 fL (ref 80.0–100.0)
MPV: 11.4 fL (ref 7.5–12.5)
Monocytes Relative: 9.6 %
Neutro Abs: 3205 {cells}/uL (ref 1500–7800)
Neutrophils Relative %: 49.3 %
Platelets: 333 Thousand/uL (ref 140–400)
RBC: 4.32 Million/uL (ref 3.80–5.10)
RDW: 11.9 % (ref 11.0–15.0)
Total Lymphocyte: 37.4 %
WBC: 6.5 Thousand/uL (ref 3.8–10.8)

## 2024-01-14 LAB — HEMOGLOBIN A1C
Hgb A1c MFr Bld: 5 % (ref <5.7)
Mean Plasma Glucose: 97 mg/dL
eAG (mmol/L): 5.4 mmol/L

## 2024-01-14 LAB — TSH: TSH: 1.69 m[IU]/L

## 2024-01-17 ENCOUNTER — Other Ambulatory Visit: Payer: Self-pay

## 2024-02-10 ENCOUNTER — Encounter: Payer: Self-pay | Admitting: Otolaryngology

## 2024-02-10 ENCOUNTER — Other Ambulatory Visit: Payer: Self-pay | Admitting: Otolaryngology

## 2024-02-10 ENCOUNTER — Other Ambulatory Visit: Payer: Self-pay

## 2024-02-10 DIAGNOSIS — E041 Nontoxic single thyroid nodule: Secondary | ICD-10-CM | POA: Diagnosis not present

## 2024-02-10 DIAGNOSIS — R599 Enlarged lymph nodes, unspecified: Secondary | ICD-10-CM | POA: Diagnosis not present

## 2024-02-10 MED ORDER — CEPHALEXIN 500 MG PO CAPS
500.0000 mg | ORAL_CAPSULE | Freq: Three times a day (TID) | ORAL | 0 refills | Status: AC
  Filled 2024-02-10: qty 30, 10d supply, fill #0

## 2024-02-11 ENCOUNTER — Ambulatory Visit
Admission: RE | Admit: 2024-02-11 | Discharge: 2024-02-11 | Disposition: A | Discharge status: Home / Self Care | Source: Ambulatory Visit | Attending: Otolaryngology | Admitting: Otolaryngology

## 2024-02-11 DIAGNOSIS — E041 Nontoxic single thyroid nodule: Secondary | ICD-10-CM | POA: Diagnosis not present

## 2024-02-17 ENCOUNTER — Other Ambulatory Visit: Payer: Self-pay | Admitting: Otolaryngology

## 2024-02-17 DIAGNOSIS — E041 Nontoxic single thyroid nodule: Secondary | ICD-10-CM

## 2024-06-18 ENCOUNTER — Other Ambulatory Visit: Payer: Self-pay

## 2024-07-15 ENCOUNTER — Ambulatory Visit: Admitting: Nurse Practitioner

## 2024-08-18 ENCOUNTER — Ambulatory Visit: Admitting: Physician Assistant
